# Patient Record
Sex: Female | Born: 1955
Health system: Southern US, Community
[De-identification: ages and names within clinical notes are randomized; demographics above are authoritative.]

---

## 2007-10-13 ENCOUNTER — Other Ambulatory Visit: Admission: RE | Admit: 2007-10-13 | Discharge: 2007-10-13 | Payer: Self-pay | Admitting: Obstetrics & Gynecology

## 2008-06-25 ENCOUNTER — Inpatient Hospital Stay (HOSPITAL_COMMUNITY): Admission: EM | Admit: 2008-06-25 | Discharge: 2008-06-28 | Payer: Self-pay | Admitting: Emergency Medicine

## 2008-06-25 ENCOUNTER — Encounter (INDEPENDENT_AMBULATORY_CARE_PROVIDER_SITE_OTHER): Payer: Self-pay | Admitting: Surgery

## 2008-06-25 ENCOUNTER — Encounter: Payer: Self-pay | Admitting: Emergency Medicine

## 2008-07-13 ENCOUNTER — Inpatient Hospital Stay (HOSPITAL_COMMUNITY): Admission: EM | Admit: 2008-07-13 | Discharge: 2008-07-14 | Payer: Self-pay | Admitting: Emergency Medicine

## 2008-07-13 ENCOUNTER — Encounter: Payer: Self-pay | Admitting: Emergency Medicine

## 2008-10-17 ENCOUNTER — Other Ambulatory Visit: Admission: RE | Admit: 2008-10-17 | Discharge: 2008-10-17 | Payer: Self-pay | Admitting: Obstetrics and Gynecology

## 2011-04-09 NOTE — H&P (Signed)
NAMELORANA, MAFFEO               ACCOUNT NO.:  [id number]   MEDICAL RECORD NO.:  [id number]          PATIENT TYPE:  INP   LOCATION:  5531                         FACILITY:  [facility]   PHYSICIAN:  Ollen Gross. Vernell Morgans, M.D. DATE OF BIRTH:  [birth year]   DATE OF ADMISSION:  07/13/2008  DATE OF DISCHARGE:                              HISTORY & PHYSICAL   The patient is a 55 year old white female who is now about 2 weeks out  from open appendectomy by Dr. Fannie Knee for perforated appendicitis.  She  initially did well and has been at home, but over the last day has  develop some redness and swelling at her incision site.  She denies any  fevers.  She has had a reasonable appetite and her bowels have been  moving regularly for her.  She came to the emergency department and was  found to have a normal white count.  No fever.  She did undergo a CT  scan that showed an abdominal wall abscess in the subcutaneous tissue.   PAST MEDICAL HISTORY:  Significant for some psychiatric issues.   PAST SURGICAL HISTORY:  Significant for an open appendectomy.   MEDICATIONS:  Some psych meds.   ALLERGIES:  No known drug allergies.   SOCIAL HISTORY:  She denies any tobacco or tobacco products.   FAMILY HISTORY:  Noncontributory.   PHYSICAL EXAMINATION:  VITAL SIGNS:  Temperature is 97.8, blood pressure  112/73, and pulse of 80.  GENERAL:  She is a well-developed, well-nourished white female, in no  acute distress.  SKIN:  Warm and dry.  No jaundice.  HEENT:  Eyes are anicteric.  Extraocular movements are intact.  Pupils  are equal, round, and reactive to light.  Sclerae nonicteric.  LUNGS:  Clear bilaterally with no use of accessory respiratory muscles.  HEART:  Regular rate and rhythm with impulse in the left chest.  ABDOMEN:  Soft.  She does in the  right lower quadrant have an area of  redness and swelling at the incision, but the incision itself is intact  and healed, does not show any signs of opening or  drainage.  EXTREMITIES:  No cyanosis, clubbing, or edema.  Good strength in arms  and legs.  PSYCHOLOGIC:  She is alert and oriented x3 with no evidence of anxiety  or depression.   ASSESSMENT AND PLAN:  This is a 55 year old white female with what  appears to be a small abscess in her subcutaneous abdominal wall after  open appendectomy over 2 weeks ago.  We will plan to admit her for IV  antibiotics and plan for ultrasound-guided aspiration of this fluid  collection by interventional radiology in the morning.      Ollen Gross. Vernell Morgans, M.D.  Electronically Signed     PST/MEDQ  D:  07/13/2008  T:  07/13/2008  Job:  [id number]

## 2011-04-09 NOTE — Discharge Summary (Signed)
Joan Nolan, Joan Nolan               ACCOUNT NO.:  [id number]   MEDICAL RECORD NO.:  [id number]          PATIENT TYPE:  INP   LOCATION:  5121                         FACILITY:  [facility]   PHYSICIAN:  Wilmon Arms. Corliss Skains, M.D. DATE OF BIRTH:  22-Sep-1956   DATE OF ADMISSION:  06/25/2008  DATE OF DISCHARGE:  06/28/2008                               DISCHARGE SUMMARY   DISCHARGING PHYSICIAN:  Wynona Luna, MD.   CHIEF COMPLAINT AND REASON FOR ADMISSION:  Joan Nolan is a 51-year female  patient with history of ADHD and anxiety and depression on multiple  medications who presents with a 3-day history of right lower quadrant  abdominal pain without nausea and vomiting.  No fever.  She was seen  initially at the [facility] in [facility].  A CT was done  that demonstrated appendicitis, and the patient was subsequently  transferred over to [facility].   PHYSICAL EXAMINATION:  On exam, the patient's vital signs were stable.  Her abdomen showed right lower quadrant pain and tenderness.  No  rebounding, no guarding.  Her white count was 16,100, hemoglobin 12.5,  platelets 219,000.  Sodium 139, potassium 3.9, creatinine 0.7.   Incidental finding was also of a right ovarian cyst on the CT scan.  The  patient was diagnosed with acute appendicitis admitted to the hospital.   ADMITTING DIAGNOSES:  1. Acute appendicitis.  2. ADHD.  3. Anxiety and depression.   HOSPITAL COURSE:  The patient was taken from the ER to the OR, where she  underwent laparoscopic converted to open appendectomy.  Please refer to  Dr. Corliss Skains operative notes for details.  Basically, there was a  significant degree of adhesive material with the appendix being very  enlarged, and adhered to the cecum and they were unable to mobilize the  appendix with a laparoscopic approach.  So, therefore she was converted  to an open appendectomy.  She did not have any perforation of the  appendix.  The patient otherwise tolerated  the procedure well and was  sent back to the general floor to recover.   In the postoperative period, the patient was placed empirically on IV  Unasyn and IV fluids, and diet was advanced slowly.  On postoperative  day #0, the patient had a T-max of 102.6.  Other than her vital signs  were stable.  Her pain was being managed with PCA and she was tolerating  a clear liquid diet.  By postop day #1 the patient's white count had  decreased to 11,300, hemoglobin was 11.7 after hydration.  She was  ambulating in the room and was ready to have her diet advanced to a soft  diet and was switched to oral pain medications.  She was given a dose of  Toradol and then transitioned over to ibuprofen and was started on p.o.  Vicodin.   On postop day number #2, June 28, 2008, the patient was afebrile.  Vital signs stable.  Abdomen was unremarkable.  Dressings were removed  to reveal clean and intact staple lines over her trocar sites, as  well  as over the much larger right lower quadrant incision.  Bowel sounds  were present.  The patient's intraoperative pathology returned positive  for acute appendicitis, as well as diverticulosis with diverticulitis.  I discussed this with Dr. Corliss Skains, and he reported that all the area of  acute diverticulitis had been removed during surgery and she would not  need antibiotic therapy regarding this issue after discharge.   FINAL AND DISCHARGE DIAGNOSES:  1. Acute appendicitis status post laparoscopic converted to open      appendectomy.  2. New diagnosis of diverticulosis with focal diverticulitis      associated with appendicitis, stable.  3. ADHD.  4. Anxiety and depression.   DISCHARGE MEDICATIONS:  The patient will resume the following home  medications:  1. Folic acid 400 mg b.i.d.  2. Strattera 25 mg two q.a.m.  3. Strattera 25 mg 1 midday  4. Strattera 25 mg 1 in the afternoon.  5. Lamictal 100 mg 1 to 1-1/2 tablet in the morning.  6. Lamictal 100 mg 1  to 1-1/2 tablet 6 hours later.  7. Vyvanse 70 mg in the morning.  8. Klonopin 0.5 mg midday.  9. Zyprexa 2.5 mg at hour of sleep.  10.Fish oil capsules in the morning.  11.Calcium 600 mg p.r.n.  12.Vitamin B complex one-half b.i.d.   NEW MEDICATIONS:  1. Vicodin 5/325 mg 1-2 tablets every 4 hours as need for pain.  2. Ibuprofen 600 mg t.i.d. as needed for pain.  May take in addition      to Vicodin, take with food or snack.   OTHER DISCHARGE INSTRUCTIONS:  No restrictions in her diet.  Wound care.  She is to pat the staple line dry after showering and if she finds that  the incisions are rubbing against her clothing or draining, or otherwise  uncomfortable, she may use a sanitary napkin or purchase dressing  material to place over these areas.   ACTIVITY:  She is to increase activity slowly.  She may walk up steps.  She may shower.  No lifting greater than 10 pounds for 4 weeks.  No  driving for 1-2 weeks, and this is based on when she is comfortable or  not utilizing Vicodin.   OTHER INSTRUCTIONS:  She is to call the surgeon if a fever greater than  or equal to 101.5 degrees Fahrenheit, the new increased belly pain, see  redness or drainage from the wounds site, nausea, vomiting, or diarrhea.   FOLLOW-UP APPOINTMENTS:  She is to call Dr. Fatima Sanger office at [phone]  to be seen in 5-7 days for staple removal.      Allison L. Rondel Jumbo. Tsuei, M.D.  Electronically Signed    ALE/MEDQ  D:  06/28/2008  T:  06/28/2008  Job:  [id number]   cc:   Wilmon Arms. Tsuei, M.D.

## 2011-04-09 NOTE — Op Note (Signed)
NAMEYOKO, MCGAHEE               ACCOUNT NO.:  [id number]   MEDICAL RECORD NO.:  [id number]          PATIENT TYPE:  INP   LOCATION:  5121                         FACILITY:  [facility]   PHYSICIAN:  Wilmon Arms. Corliss Skains, M.D. DATE OF BIRTH:  January 02, 1956   DATE OF PROCEDURE:  06/26/2008  DATE OF DISCHARGE:                               OPERATIVE REPORT   PREOPERATIVE DIAGNOSIS:  Acute appendicitis.   POSTOPERATIVE DIAGNOSIS:  Acute appendicitis.   PROCEDURE PERFORMED:  Laparoscopic-converted open appendectomy.   SURGEON:  Wilmon Arms. Tsuei, MD   ANESTHESIA:  General endotracheal.   INDICATIONS:  The patient is a 56 year old female who presents with 3 or  4 days of right lower quadrant abdominal pain associated with some mild  nausea.  She was noted to have elevated white count.  Her abdomen was  only mildly tender in the right lower quadrant.  However, a CT scan  showed a very inflamed right lower quadrant consistent with  appendicitis.  They could not rule out an abscess.  We were consulted  and recommended to bring the patient directly to the operating room for  laparoscopic appendectomy.   DESCRIPTION OF PROCEDURE:  The patient was brought to the operating room  and placed in the supine position on the operating table.  After an  adequate level of general anesthesia was obtained, a Foley catheter was  placed under sterile technique.  The patient's abdomen was prepped with  Betadine and draped in a sterile fashion.  A time-out was taken to  assure the proper patient and proper procedure.  We infiltrated the area  below her umbilicus with 0.25% Marcaine with epinephrine.  A transverse  incision was made and dissection was carried down to the fascia.  The  fascia was grasped with Kocher clamps and opened vertically.  The  peritoneal cavity was bluntly entered.  A stay suture of 0 Vicryl was  placed around the fascial opening.  Pneumoperitoneum was obtained  insufflating CO2, maintaining  maximal pressure of 15 mmHg.  The  laparoscope was inserted and we did not note any obvious purulent.  There was a lot of free fluid in the pelvis.  A 5-mm port was placed in  the right upper quadrant and another 5-mm port in the left lower  quadrant.  We switched to a 5-mm 30-degree scope.  We moved to the right  upper quadrant.  We then mobilized the cecum medially.  There were a lot  of dense inflammatory adhesions in the right lower quadrant.  We tried  to carefully dissect through these.  However, the cecum and what  appeared to be the appendix were densely adherent down into the pelvis.  We bluntly dissected under the uterus.  It appeared that the enlarged  appendix was heading down into the pelvis adjacent to the fallopian tube  on the right.  Despite continued meticulous blunt dissection, we were  unable to mobilize the appendix.  Therefore, the decision was made to  convert to an open procedure.  We made a transverse incision in the  right lower quadrant directly over the  base of the cecum.  We used a  Engineer, manufacturing for exposure.  We bluntly dissected a very large  appendix free from the surrounding tissue with blunt dissection as well  as the harmonic scalpel.  The base of the appendix was then divided with  a GIA-55 stapler.  We inspected the surrounding tissue and there was no  sign of leak or bleeding.  We thoroughly irrigated the pelvis and  suctioned dry.  The peritoneum was closed with running layer of 2-0  Vicryl.  A 0 PDS was used to close the fascia.  Staples were used to close all  the skin incisions.  The patient was then extubated and brought to the  recovery room in stable condition.  All sponge, instrument, and needle  counts were correct.      Wilmon Arms. Tsuei, M.D.  Electronically Signed     MKT/MEDQ  D:  06/26/2008  T:  06/26/2008  Job:  [id number]

## 2011-04-09 NOTE — Discharge Summary (Signed)
[name], [name]               ACCOUNT NO.:  [id number]   MEDICAL RECORD NO.:  [id number]          PATIENT TYPE:  INP   LOCATION:  5531                         FACILITY:  [facility]   PHYSICIAN:  Ardeth Sportsman, MD     DATE OF BIRTH:  04/18/1956   DATE OF ADMISSION:  07/13/2008  DATE OF DISCHARGE:  07/14/2008                               DISCHARGE SUMMARY   ADMITTING PHYSICIAN:  Ollen Gross. Vernell Morgans, M.D.   DISCHARGING PHYSICIAN:  Ardeth Sportsman, MD   PRIMARY SURGEON:  Wilmon Arms. Corliss Skains, MD   CHIEF COMPLAINT/REASON FOR ADMISSION:  Ms. Joan Nolan is a 55 year old female  patient about 2 weeks out from laparoscopy converted to open  appendectomy for a nonperforated appendix.  There was some difficulty in  removing the appendix due to its adherence to the cecum and that is what  warranted the open appendectomy procedure.  She presented to the ER on  the day of admission with a 1-day history of redness and swelling at her  incision site.  Her white cell count was normal.  She had no fever.  CT  demonstrated an abdominal wall abscess without any intraperitoneal  abscess or any communication with the intraperitoneal cavity.  The  patient was subsequently admitted by Dr. Carolynne Edouard with the following  diagnoses:  1. Right lower quadrant abdominal wall abscess at site of prior open      appendectomy.  2. Attention deficit hyperactivity disorder.  3. Anxiety and depression.   HOSPITAL COURSE:  The patient was admitted as stated and placed on  n.p.o. status.  Started empirically on IV Zosyn and Interventional  Radiology was consulted for percutaneous drainage of the said abscess.   On hospital day #1, July 13, 2008, the patient was taken to  Interventional Radiology where she underwent a successful placement of a  percutaneous drain into the right lower quadrant abdominal wall fluid  collection cavity.  Fluid returned was bloody and serosanguineous.  Cultures were obtained and on the date of  discharge, hospital day #2,  the patient's cultures had returned back consistent with moderate gram-  positive cocci in pairs.  Final culture was pending.  The patient was  having no pain, prior incisional redness had resolved, and she was  otherwise deemed appropriate for discharge home.  She received previous  instruction on flushing and management of the drain and plans were for  her to return home with management of her own drain.  Her vital signs  were stable and she was afebrile.   FINAL DISCHARGE DIAGNOSES:  1. Seroma, right lower quadrant abdominal wall with preliminary      cultures showing gram-positive cocci in pairs.  2. Appendicitis, suppurative nonperforated.   DISCHARGE MEDICATIONS:  1. The patient will resume the same home medication she was taking      prior to last admission.  Please see discharge summary from      previous admission.  2. In addition, she will be taking Augmentin 875 mg b.i.d. for 7 days      for empiric coverage until cultures are finalized from  the seroma      aspiration.  3. She will utilize over-the-counter ibuprofen and Tylenol as needed      for pain.   Return to work as previously discussed.  I think her prior instructions  stated a total of 4-5 weeks due to the open appendectomy procedure.   DIET:  No restrictions.   WOUND CARE:  Flush the right abdominal drain every 8 hours or 3 times a  day with 10 mL of normal saline.  Record drainage output, noting that  once drainage minus the flushes is down to 10 mL in a 24-hour period.  She will need a repeat CT scan.   ACTIVITY:  Increase activity slowly.  Sponge bathe only while the drain  is in place.  No lifting for the next 2 weeks.   FOLLOWUP:  I would recommend that she see Dr. Corliss Skains in 1 week.  Hopefully, I will be able to make this appointment for the patient prior  to her discharge.      Allison L. Eliott Nine, MD  Electronically Signed    ALE/MEDQ   D:  07/14/2008  T:  07/14/2008  Job:  [id number]   cc:   Wilmon Arms. Tsuei, M.D.

## 2011-08-23 LAB — DIFFERENTIAL
Basophils Relative: 1
Eosinophils Absolute: 0.2
Eosinophils Relative: 1
Lymphs Abs: 1.3
Monocytes Absolute: 1.4 — ABNORMAL HIGH
Neutro Abs: 13 — ABNORMAL HIGH
Neutrophils Relative %: 81 — ABNORMAL HIGH

## 2011-08-23 LAB — CBC
HCT: 37.8
Hemoglobin: 11.7 — ABNORMAL LOW
Hemoglobin: 12.2
Hemoglobin: 12.5
MCHC: 32.5
MCHC: 33
MCHC: 33.2
MCV: 75.8 — ABNORMAL LOW
Platelets: 218
RBC: 4.68
RBC: 4.98
RDW: 18 — ABNORMAL HIGH
RDW: 18.3 — ABNORMAL HIGH
WBC: 16.1 — ABNORMAL HIGH

## 2011-08-23 LAB — URINALYSIS, ROUTINE W REFLEX MICROSCOPIC
Hgb urine dipstick: NEGATIVE
Ketones, ur: 15 — AB
Nitrite: NEGATIVE
pH: 5.5

## 2011-08-23 LAB — BASIC METABOLIC PANEL
BUN: 13
BUN: 5 — ABNORMAL LOW
CO2: 27
CO2: 29
Calcium: 8 — ABNORMAL LOW
Calcium: 8.9
Chloride: 98
Creatinine, Ser: 0.7
GFR calc non Af Amer: >60
Glucose, Bld: 141 — ABNORMAL HIGH
Glucose, Bld: 93
Sodium: 135

## 2016-08-09 DIAGNOSIS — F840 Autistic disorder: Secondary | ICD-10-CM | POA: Diagnosis not present

## 2016-08-20 DIAGNOSIS — F840 Autistic disorder: Secondary | ICD-10-CM | POA: Diagnosis not present

## 2016-09-03 DIAGNOSIS — F840 Autistic disorder: Secondary | ICD-10-CM | POA: Diagnosis not present

## 2016-09-09 DIAGNOSIS — F411 Generalized anxiety disorder: Secondary | ICD-10-CM | POA: Diagnosis not present

## 2016-09-09 DIAGNOSIS — F3170 Bipolar disorder, currently in remission, most recent episode unspecified: Secondary | ICD-10-CM | POA: Diagnosis not present

## 2016-09-17 DIAGNOSIS — F840 Autistic disorder: Secondary | ICD-10-CM | POA: Diagnosis not present

## 2016-10-01 DIAGNOSIS — F840 Autistic disorder: Secondary | ICD-10-CM | POA: Diagnosis not present

## 2016-10-11 DIAGNOSIS — F840 Autistic disorder: Secondary | ICD-10-CM | POA: Diagnosis not present

## 2016-12-06 DIAGNOSIS — R928 Other abnormal and inconclusive findings on diagnostic imaging of breast: Secondary | ICD-10-CM | POA: Diagnosis not present

## 2016-12-06 DIAGNOSIS — R922 Inconclusive mammogram: Secondary | ICD-10-CM | POA: Diagnosis not present

## 2017-01-22 DIAGNOSIS — F411 Generalized anxiety disorder: Secondary | ICD-10-CM | POA: Diagnosis not present

## 2017-01-22 DIAGNOSIS — F3170 Bipolar disorder, currently in remission, most recent episode unspecified: Secondary | ICD-10-CM | POA: Diagnosis not present

## 2017-01-22 DIAGNOSIS — F29 Unspecified psychosis not due to a substance or known physiological condition: Secondary | ICD-10-CM | POA: Diagnosis not present

## 2017-01-22 DIAGNOSIS — G2571 Drug induced akathisia: Secondary | ICD-10-CM | POA: Diagnosis not present

## 2017-02-19 DIAGNOSIS — Z6832 Body mass index (BMI) 32.0-32.9, adult: Secondary | ICD-10-CM | POA: Diagnosis not present

## 2017-02-19 DIAGNOSIS — Z01419 Encounter for gynecological examination (general) (routine) without abnormal findings: Secondary | ICD-10-CM | POA: Diagnosis not present

## 2017-02-19 DIAGNOSIS — Z803 Family history of malignant neoplasm of breast: Secondary | ICD-10-CM | POA: Diagnosis not present

## 2017-04-03 DIAGNOSIS — F339 Major depressive disorder, recurrent, unspecified: Secondary | ICD-10-CM | POA: Diagnosis not present

## 2017-04-03 DIAGNOSIS — Z6841 Body Mass Index (BMI) 40.0 and over, adult: Secondary | ICD-10-CM | POA: Diagnosis not present

## 2017-04-03 DIAGNOSIS — E559 Vitamin D deficiency, unspecified: Secondary | ICD-10-CM | POA: Diagnosis not present

## 2017-04-03 DIAGNOSIS — Z0001 Encounter for general adult medical examination with abnormal findings: Secondary | ICD-10-CM | POA: Diagnosis not present

## 2017-04-03 DIAGNOSIS — Z0000 Encounter for general adult medical examination without abnormal findings: Secondary | ICD-10-CM | POA: Diagnosis not present

## 2017-05-08 DIAGNOSIS — J069 Acute upper respiratory infection, unspecified: Secondary | ICD-10-CM | POA: Diagnosis not present

## 2017-05-11 DIAGNOSIS — J069 Acute upper respiratory infection, unspecified: Secondary | ICD-10-CM | POA: Diagnosis not present

## 2017-05-11 DIAGNOSIS — S058X1A Other injuries of right eye and orbit, initial encounter: Secondary | ICD-10-CM | POA: Diagnosis not present

## 2017-06-11 DIAGNOSIS — F29 Unspecified psychosis not due to a substance or known physiological condition: Secondary | ICD-10-CM | POA: Diagnosis not present

## 2017-06-11 DIAGNOSIS — F3170 Bipolar disorder, currently in remission, most recent episode unspecified: Secondary | ICD-10-CM | POA: Diagnosis not present

## 2017-06-11 DIAGNOSIS — G2571 Drug induced akathisia: Secondary | ICD-10-CM | POA: Diagnosis not present

## 2017-06-11 DIAGNOSIS — F411 Generalized anxiety disorder: Secondary | ICD-10-CM | POA: Diagnosis not present

## 2017-07-18 DIAGNOSIS — H5213 Myopia, bilateral: Secondary | ICD-10-CM | POA: Diagnosis not present

## 2017-07-25 DIAGNOSIS — N6489 Other specified disorders of breast: Secondary | ICD-10-CM | POA: Diagnosis not present

## 2017-07-25 DIAGNOSIS — N6001 Solitary cyst of right breast: Secondary | ICD-10-CM | POA: Diagnosis not present

## 2017-07-25 DIAGNOSIS — N6312 Unspecified lump in the right breast, upper inner quadrant: Secondary | ICD-10-CM | POA: Diagnosis not present

## 2017-08-26 ENCOUNTER — Ambulatory Visit (INDEPENDENT_AMBULATORY_CARE_PROVIDER_SITE_OTHER): Payer: BLUE CROSS/BLUE SHIELD | Admitting: Clinical

## 2017-08-26 DIAGNOSIS — F840 Autistic disorder: Secondary | ICD-10-CM

## 2017-09-09 ENCOUNTER — Ambulatory Visit (INDEPENDENT_AMBULATORY_CARE_PROVIDER_SITE_OTHER): Payer: BLUE CROSS/BLUE SHIELD | Admitting: Clinical

## 2017-09-09 DIAGNOSIS — F840 Autistic disorder: Secondary | ICD-10-CM

## 2017-10-07 ENCOUNTER — Ambulatory Visit (INDEPENDENT_AMBULATORY_CARE_PROVIDER_SITE_OTHER): Payer: BLUE CROSS/BLUE SHIELD | Admitting: Clinical

## 2017-10-07 DIAGNOSIS — F840 Autistic disorder: Secondary | ICD-10-CM

## 2017-10-20 DIAGNOSIS — G2571 Drug induced akathisia: Secondary | ICD-10-CM | POA: Diagnosis not present

## 2017-10-20 DIAGNOSIS — F3170 Bipolar disorder, currently in remission, most recent episode unspecified: Secondary | ICD-10-CM | POA: Diagnosis not present

## 2017-10-20 DIAGNOSIS — F411 Generalized anxiety disorder: Secondary | ICD-10-CM | POA: Diagnosis not present

## 2017-10-20 DIAGNOSIS — F29 Unspecified psychosis not due to a substance or known physiological condition: Secondary | ICD-10-CM | POA: Diagnosis not present

## 2017-11-04 ENCOUNTER — Ambulatory Visit: Payer: Self-pay | Admitting: Clinical

## 2017-11-11 ENCOUNTER — Ambulatory Visit (INDEPENDENT_AMBULATORY_CARE_PROVIDER_SITE_OTHER): Payer: BLUE CROSS/BLUE SHIELD | Admitting: Clinical

## 2017-11-11 DIAGNOSIS — F840 Autistic disorder: Secondary | ICD-10-CM

## 2017-12-16 ENCOUNTER — Ambulatory Visit: Payer: BLUE CROSS/BLUE SHIELD | Admitting: Clinical

## 2017-12-16 DIAGNOSIS — F840 Autistic disorder: Secondary | ICD-10-CM | POA: Diagnosis not present

## 2018-01-15 ENCOUNTER — Ambulatory Visit: Payer: BLUE CROSS/BLUE SHIELD | Admitting: Clinical

## 2018-01-15 DIAGNOSIS — F840 Autistic disorder: Secondary | ICD-10-CM | POA: Diagnosis not present

## 2018-02-12 ENCOUNTER — Ambulatory Visit: Payer: BLUE CROSS/BLUE SHIELD | Admitting: Clinical

## 2018-02-12 DIAGNOSIS — F840 Autistic disorder: Secondary | ICD-10-CM

## 2018-03-12 ENCOUNTER — Ambulatory Visit (INDEPENDENT_AMBULATORY_CARE_PROVIDER_SITE_OTHER): Payer: BLUE CROSS/BLUE SHIELD | Admitting: Clinical

## 2018-03-12 DIAGNOSIS — F840 Autistic disorder: Secondary | ICD-10-CM | POA: Diagnosis not present

## 2018-03-23 DIAGNOSIS — Z23 Encounter for immunization: Secondary | ICD-10-CM | POA: Diagnosis not present

## 2018-03-26 ENCOUNTER — Ambulatory Visit: Payer: BLUE CROSS/BLUE SHIELD | Admitting: Clinical

## 2018-03-26 DIAGNOSIS — F840 Autistic disorder: Secondary | ICD-10-CM

## 2018-04-01 DIAGNOSIS — F3170 Bipolar disorder, currently in remission, most recent episode unspecified: Secondary | ICD-10-CM | POA: Diagnosis not present

## 2018-04-01 DIAGNOSIS — F29 Unspecified psychosis not due to a substance or known physiological condition: Secondary | ICD-10-CM | POA: Diagnosis not present

## 2018-04-01 DIAGNOSIS — F411 Generalized anxiety disorder: Secondary | ICD-10-CM | POA: Diagnosis not present

## 2018-04-01 DIAGNOSIS — G2571 Drug induced akathisia: Secondary | ICD-10-CM | POA: Diagnosis not present

## 2018-04-02 ENCOUNTER — Ambulatory Visit: Payer: BLUE CROSS/BLUE SHIELD | Admitting: Clinical

## 2018-04-02 DIAGNOSIS — F840 Autistic disorder: Secondary | ICD-10-CM | POA: Diagnosis not present

## 2018-04-16 ENCOUNTER — Ambulatory Visit: Payer: BLUE CROSS/BLUE SHIELD | Admitting: Clinical

## 2018-04-16 DIAGNOSIS — F840 Autistic disorder: Secondary | ICD-10-CM | POA: Diagnosis not present

## 2018-04-23 ENCOUNTER — Ambulatory Visit: Payer: BLUE CROSS/BLUE SHIELD | Admitting: Clinical

## 2018-04-23 DIAGNOSIS — F319 Bipolar disorder, unspecified: Secondary | ICD-10-CM

## 2018-04-30 ENCOUNTER — Ambulatory Visit: Payer: BLUE CROSS/BLUE SHIELD | Admitting: Clinical

## 2018-04-30 DIAGNOSIS — F840 Autistic disorder: Secondary | ICD-10-CM

## 2018-05-07 ENCOUNTER — Ambulatory Visit: Payer: BLUE CROSS/BLUE SHIELD | Admitting: Clinical

## 2018-05-07 DIAGNOSIS — F319 Bipolar disorder, unspecified: Secondary | ICD-10-CM

## 2018-05-14 ENCOUNTER — Ambulatory Visit: Payer: BLUE CROSS/BLUE SHIELD | Admitting: Clinical

## 2018-05-14 DIAGNOSIS — F319 Bipolar disorder, unspecified: Secondary | ICD-10-CM | POA: Diagnosis not present

## 2018-05-21 ENCOUNTER — Ambulatory Visit: Payer: BLUE CROSS/BLUE SHIELD | Admitting: Clinical

## 2018-05-28 DIAGNOSIS — Z23 Encounter for immunization: Secondary | ICD-10-CM | POA: Diagnosis not present

## 2018-06-04 ENCOUNTER — Ambulatory Visit: Payer: BLUE CROSS/BLUE SHIELD | Admitting: Clinical

## 2018-06-04 DIAGNOSIS — F319 Bipolar disorder, unspecified: Secondary | ICD-10-CM | POA: Diagnosis not present

## 2018-06-11 ENCOUNTER — Ambulatory Visit: Payer: BLUE CROSS/BLUE SHIELD | Admitting: Clinical

## 2018-06-11 DIAGNOSIS — F319 Bipolar disorder, unspecified: Secondary | ICD-10-CM | POA: Diagnosis not present

## 2018-06-18 ENCOUNTER — Ambulatory Visit: Payer: BLUE CROSS/BLUE SHIELD | Admitting: Clinical

## 2018-06-18 DIAGNOSIS — F319 Bipolar disorder, unspecified: Secondary | ICD-10-CM | POA: Diagnosis not present

## 2018-07-02 ENCOUNTER — Ambulatory Visit (INDEPENDENT_AMBULATORY_CARE_PROVIDER_SITE_OTHER): Payer: BLUE CROSS/BLUE SHIELD | Admitting: Clinical

## 2018-07-02 DIAGNOSIS — F319 Bipolar disorder, unspecified: Secondary | ICD-10-CM

## 2018-07-09 ENCOUNTER — Ambulatory Visit: Payer: BLUE CROSS/BLUE SHIELD | Admitting: Clinical

## 2018-07-09 DIAGNOSIS — F319 Bipolar disorder, unspecified: Secondary | ICD-10-CM | POA: Diagnosis not present

## 2018-07-16 ENCOUNTER — Ambulatory Visit: Payer: BLUE CROSS/BLUE SHIELD | Admitting: Clinical

## 2018-07-16 DIAGNOSIS — F840 Autistic disorder: Secondary | ICD-10-CM | POA: Diagnosis not present

## 2018-07-21 DIAGNOSIS — F3170 Bipolar disorder, currently in remission, most recent episode unspecified: Secondary | ICD-10-CM | POA: Diagnosis not present

## 2018-07-21 DIAGNOSIS — F411 Generalized anxiety disorder: Secondary | ICD-10-CM | POA: Diagnosis not present

## 2018-07-21 DIAGNOSIS — F29 Unspecified psychosis not due to a substance or known physiological condition: Secondary | ICD-10-CM | POA: Diagnosis not present

## 2018-07-21 DIAGNOSIS — G2571 Drug induced akathisia: Secondary | ICD-10-CM | POA: Diagnosis not present

## 2018-07-23 ENCOUNTER — Ambulatory Visit: Payer: BLUE CROSS/BLUE SHIELD | Admitting: Clinical

## 2018-07-23 DIAGNOSIS — F319 Bipolar disorder, unspecified: Secondary | ICD-10-CM | POA: Diagnosis not present

## 2018-08-06 ENCOUNTER — Ambulatory Visit: Payer: BLUE CROSS/BLUE SHIELD | Admitting: Clinical

## 2018-08-06 DIAGNOSIS — F319 Bipolar disorder, unspecified: Secondary | ICD-10-CM

## 2018-08-12 DIAGNOSIS — F3170 Bipolar disorder, currently in remission, most recent episode unspecified: Secondary | ICD-10-CM | POA: Diagnosis not present

## 2018-08-12 DIAGNOSIS — F411 Generalized anxiety disorder: Secondary | ICD-10-CM | POA: Diagnosis not present

## 2018-08-12 DIAGNOSIS — F29 Unspecified psychosis not due to a substance or known physiological condition: Secondary | ICD-10-CM | POA: Diagnosis not present

## 2018-08-12 DIAGNOSIS — G2571 Drug induced akathisia: Secondary | ICD-10-CM | POA: Diagnosis not present

## 2018-08-13 ENCOUNTER — Ambulatory Visit: Payer: BLUE CROSS/BLUE SHIELD | Admitting: Clinical

## 2018-08-13 DIAGNOSIS — F319 Bipolar disorder, unspecified: Secondary | ICD-10-CM

## 2018-08-20 ENCOUNTER — Ambulatory Visit: Payer: BLUE CROSS/BLUE SHIELD | Admitting: Clinical

## 2018-08-20 DIAGNOSIS — F319 Bipolar disorder, unspecified: Secondary | ICD-10-CM

## 2018-08-24 ENCOUNTER — Ambulatory Visit: Payer: BLUE CROSS/BLUE SHIELD | Admitting: Clinical

## 2018-08-24 DIAGNOSIS — F319 Bipolar disorder, unspecified: Secondary | ICD-10-CM

## 2018-09-01 DIAGNOSIS — F29 Unspecified psychosis not due to a substance or known physiological condition: Secondary | ICD-10-CM | POA: Diagnosis not present

## 2018-09-01 DIAGNOSIS — F3170 Bipolar disorder, currently in remission, most recent episode unspecified: Secondary | ICD-10-CM | POA: Diagnosis not present

## 2018-09-01 DIAGNOSIS — F411 Generalized anxiety disorder: Secondary | ICD-10-CM | POA: Diagnosis not present

## 2018-09-01 DIAGNOSIS — G2571 Drug induced akathisia: Secondary | ICD-10-CM | POA: Diagnosis not present

## 2018-09-03 ENCOUNTER — Ambulatory Visit: Payer: BLUE CROSS/BLUE SHIELD | Admitting: Clinical

## 2018-09-03 DIAGNOSIS — F319 Bipolar disorder, unspecified: Secondary | ICD-10-CM

## 2018-09-10 ENCOUNTER — Ambulatory Visit: Payer: BLUE CROSS/BLUE SHIELD | Admitting: Clinical

## 2018-09-10 DIAGNOSIS — F319 Bipolar disorder, unspecified: Secondary | ICD-10-CM | POA: Diagnosis not present

## 2018-09-17 ENCOUNTER — Ambulatory Visit: Payer: BLUE CROSS/BLUE SHIELD | Admitting: Clinical

## 2018-09-17 DIAGNOSIS — F319 Bipolar disorder, unspecified: Secondary | ICD-10-CM

## 2018-09-24 ENCOUNTER — Ambulatory Visit: Payer: BLUE CROSS/BLUE SHIELD | Admitting: Clinical

## 2018-09-29 DIAGNOSIS — F411 Generalized anxiety disorder: Secondary | ICD-10-CM | POA: Diagnosis not present

## 2018-09-29 DIAGNOSIS — F3170 Bipolar disorder, currently in remission, most recent episode unspecified: Secondary | ICD-10-CM | POA: Diagnosis not present

## 2018-09-29 DIAGNOSIS — F29 Unspecified psychosis not due to a substance or known physiological condition: Secondary | ICD-10-CM | POA: Diagnosis not present

## 2018-09-29 DIAGNOSIS — G2571 Drug induced akathisia: Secondary | ICD-10-CM | POA: Diagnosis not present

## 2018-10-01 ENCOUNTER — Ambulatory Visit: Payer: BLUE CROSS/BLUE SHIELD | Admitting: Clinical

## 2018-10-01 DIAGNOSIS — F319 Bipolar disorder, unspecified: Secondary | ICD-10-CM

## 2018-10-08 ENCOUNTER — Ambulatory Visit: Payer: BLUE CROSS/BLUE SHIELD | Admitting: Clinical

## 2018-10-08 DIAGNOSIS — F319 Bipolar disorder, unspecified: Secondary | ICD-10-CM | POA: Diagnosis not present

## 2018-10-15 ENCOUNTER — Ambulatory Visit: Payer: BLUE CROSS/BLUE SHIELD | Admitting: Clinical

## 2018-10-15 DIAGNOSIS — F319 Bipolar disorder, unspecified: Secondary | ICD-10-CM

## 2018-10-29 ENCOUNTER — Ambulatory Visit: Payer: BLUE CROSS/BLUE SHIELD | Admitting: Clinical

## 2018-10-29 DIAGNOSIS — F319 Bipolar disorder, unspecified: Secondary | ICD-10-CM | POA: Diagnosis not present

## 2018-11-05 ENCOUNTER — Ambulatory Visit: Payer: BLUE CROSS/BLUE SHIELD | Admitting: Clinical

## 2018-11-05 DIAGNOSIS — F319 Bipolar disorder, unspecified: Secondary | ICD-10-CM | POA: Diagnosis not present

## 2018-11-12 ENCOUNTER — Ambulatory Visit: Payer: BLUE CROSS/BLUE SHIELD | Admitting: Clinical

## 2018-11-12 DIAGNOSIS — F319 Bipolar disorder, unspecified: Secondary | ICD-10-CM

## 2018-12-02 DIAGNOSIS — Z803 Family history of malignant neoplasm of breast: Secondary | ICD-10-CM | POA: Diagnosis not present

## 2018-12-02 DIAGNOSIS — Z1239 Encounter for other screening for malignant neoplasm of breast: Secondary | ICD-10-CM | POA: Diagnosis not present

## 2018-12-02 DIAGNOSIS — Z01419 Encounter for gynecological examination (general) (routine) without abnormal findings: Secondary | ICD-10-CM | POA: Diagnosis not present

## 2018-12-02 DIAGNOSIS — Z6831 Body mass index (BMI) 31.0-31.9, adult: Secondary | ICD-10-CM | POA: Diagnosis not present

## 2018-12-03 ENCOUNTER — Ambulatory Visit (INDEPENDENT_AMBULATORY_CARE_PROVIDER_SITE_OTHER): Payer: BLUE CROSS/BLUE SHIELD | Admitting: Clinical

## 2018-12-03 DIAGNOSIS — F319 Bipolar disorder, unspecified: Secondary | ICD-10-CM | POA: Diagnosis not present

## 2018-12-10 ENCOUNTER — Ambulatory Visit: Payer: BLUE CROSS/BLUE SHIELD | Admitting: Clinical

## 2018-12-17 ENCOUNTER — Ambulatory Visit (INDEPENDENT_AMBULATORY_CARE_PROVIDER_SITE_OTHER): Payer: BLUE CROSS/BLUE SHIELD | Admitting: Clinical

## 2018-12-17 DIAGNOSIS — F319 Bipolar disorder, unspecified: Secondary | ICD-10-CM

## 2018-12-24 ENCOUNTER — Ambulatory Visit: Payer: BLUE CROSS/BLUE SHIELD | Admitting: Clinical

## 2018-12-31 ENCOUNTER — Ambulatory Visit (INDEPENDENT_AMBULATORY_CARE_PROVIDER_SITE_OTHER): Payer: BLUE CROSS/BLUE SHIELD | Admitting: Clinical

## 2018-12-31 DIAGNOSIS — F319 Bipolar disorder, unspecified: Secondary | ICD-10-CM | POA: Diagnosis not present

## 2019-01-05 DIAGNOSIS — F3170 Bipolar disorder, currently in remission, most recent episode unspecified: Secondary | ICD-10-CM | POA: Diagnosis not present

## 2019-01-05 DIAGNOSIS — F29 Unspecified psychosis not due to a substance or known physiological condition: Secondary | ICD-10-CM | POA: Diagnosis not present

## 2019-01-05 DIAGNOSIS — G2571 Drug induced akathisia: Secondary | ICD-10-CM | POA: Diagnosis not present

## 2019-01-05 DIAGNOSIS — F411 Generalized anxiety disorder: Secondary | ICD-10-CM | POA: Diagnosis not present

## 2019-01-07 ENCOUNTER — Ambulatory Visit: Payer: BLUE CROSS/BLUE SHIELD | Admitting: Clinical

## 2019-01-14 ENCOUNTER — Ambulatory Visit: Payer: BLUE CROSS/BLUE SHIELD | Admitting: Clinical

## 2019-01-21 ENCOUNTER — Ambulatory Visit: Payer: BLUE CROSS/BLUE SHIELD | Admitting: Clinical

## 2019-01-28 ENCOUNTER — Ambulatory Visit: Payer: BLUE CROSS/BLUE SHIELD | Admitting: Clinical

## 2019-02-04 ENCOUNTER — Ambulatory Visit: Payer: BLUE CROSS/BLUE SHIELD | Admitting: Clinical

## 2019-02-23 DIAGNOSIS — F3170 Bipolar disorder, currently in remission, most recent episode unspecified: Secondary | ICD-10-CM | POA: Diagnosis not present

## 2019-02-23 DIAGNOSIS — F411 Generalized anxiety disorder: Secondary | ICD-10-CM | POA: Diagnosis not present

## 2019-02-23 DIAGNOSIS — F29 Unspecified psychosis not due to a substance or known physiological condition: Secondary | ICD-10-CM | POA: Diagnosis not present

## 2019-02-23 DIAGNOSIS — G2571 Drug induced akathisia: Secondary | ICD-10-CM | POA: Diagnosis not present

## 2019-02-25 ENCOUNTER — Ambulatory Visit: Payer: BLUE CROSS/BLUE SHIELD | Admitting: Clinical

## 2019-03-18 ENCOUNTER — Ambulatory Visit: Payer: BLUE CROSS/BLUE SHIELD | Admitting: Clinical

## 2019-04-01 ENCOUNTER — Ambulatory Visit: Payer: BLUE CROSS/BLUE SHIELD | Admitting: Clinical

## 2019-04-15 ENCOUNTER — Ambulatory Visit: Payer: BLUE CROSS/BLUE SHIELD | Admitting: Clinical

## 2019-05-20 DIAGNOSIS — F4320 Adjustment disorder, unspecified: Secondary | ICD-10-CM | POA: Diagnosis not present

## 2019-05-27 DIAGNOSIS — F4320 Adjustment disorder, unspecified: Secondary | ICD-10-CM | POA: Diagnosis not present

## 2019-06-03 DIAGNOSIS — F4320 Adjustment disorder, unspecified: Secondary | ICD-10-CM | POA: Diagnosis not present

## 2019-06-10 DIAGNOSIS — F4320 Adjustment disorder, unspecified: Secondary | ICD-10-CM | POA: Diagnosis not present

## 2019-06-17 DIAGNOSIS — F4320 Adjustment disorder, unspecified: Secondary | ICD-10-CM | POA: Diagnosis not present

## 2019-06-24 DIAGNOSIS — F4320 Adjustment disorder, unspecified: Secondary | ICD-10-CM | POA: Diagnosis not present

## 2019-06-29 DIAGNOSIS — F411 Generalized anxiety disorder: Secondary | ICD-10-CM | POA: Diagnosis not present

## 2019-06-29 DIAGNOSIS — F29 Unspecified psychosis not due to a substance or known physiological condition: Secondary | ICD-10-CM | POA: Diagnosis not present

## 2019-06-29 DIAGNOSIS — F3170 Bipolar disorder, currently in remission, most recent episode unspecified: Secondary | ICD-10-CM | POA: Diagnosis not present

## 2019-06-29 DIAGNOSIS — G2571 Drug induced akathisia: Secondary | ICD-10-CM | POA: Diagnosis not present

## 2019-07-08 DIAGNOSIS — F4320 Adjustment disorder, unspecified: Secondary | ICD-10-CM | POA: Diagnosis not present

## 2019-07-15 DIAGNOSIS — F4320 Adjustment disorder, unspecified: Secondary | ICD-10-CM | POA: Diagnosis not present

## 2019-07-22 DIAGNOSIS — F4320 Adjustment disorder, unspecified: Secondary | ICD-10-CM | POA: Diagnosis not present

## 2019-07-27 ENCOUNTER — Other Ambulatory Visit: Payer: Self-pay | Admitting: Internal Medicine

## 2019-07-27 ENCOUNTER — Other Ambulatory Visit: Payer: Self-pay

## 2019-07-27 ENCOUNTER — Ambulatory Visit
Admission: RE | Admit: 2019-07-27 | Discharge: 2019-07-27 | Disposition: A | Discharge status: Home / Self Care | Payer: BC Managed Care – PPO | Source: Ambulatory Visit | Attending: Internal Medicine | Admitting: Internal Medicine

## 2019-07-27 DIAGNOSIS — Z0000 Encounter for general adult medical examination without abnormal findings: Secondary | ICD-10-CM | POA: Diagnosis not present

## 2019-07-27 DIAGNOSIS — M25562 Pain in left knee: Secondary | ICD-10-CM

## 2019-07-27 DIAGNOSIS — B351 Tinea unguium: Secondary | ICD-10-CM | POA: Diagnosis not present

## 2019-07-27 DIAGNOSIS — M25462 Effusion, left knee: Secondary | ICD-10-CM | POA: Diagnosis not present

## 2019-07-27 DIAGNOSIS — Z6829 Body mass index (BMI) 29.0-29.9, adult: Secondary | ICD-10-CM | POA: Diagnosis not present

## 2019-07-27 DIAGNOSIS — M25569 Pain in unspecified knee: Secondary | ICD-10-CM | POA: Diagnosis not present

## 2019-08-12 DIAGNOSIS — F4320 Adjustment disorder, unspecified: Secondary | ICD-10-CM | POA: Diagnosis not present

## 2019-08-18 DIAGNOSIS — F29 Unspecified psychosis not due to a substance or known physiological condition: Secondary | ICD-10-CM | POA: Diagnosis not present

## 2019-08-18 DIAGNOSIS — F411 Generalized anxiety disorder: Secondary | ICD-10-CM | POA: Diagnosis not present

## 2019-08-18 DIAGNOSIS — F3170 Bipolar disorder, currently in remission, most recent episode unspecified: Secondary | ICD-10-CM | POA: Diagnosis not present

## 2019-08-18 DIAGNOSIS — G2571 Drug induced akathisia: Secondary | ICD-10-CM | POA: Diagnosis not present

## 2019-08-19 DIAGNOSIS — F4320 Adjustment disorder, unspecified: Secondary | ICD-10-CM | POA: Diagnosis not present

## 2019-08-26 DIAGNOSIS — F4320 Adjustment disorder, unspecified: Secondary | ICD-10-CM | POA: Diagnosis not present

## 2019-08-31 DIAGNOSIS — L918 Other hypertrophic disorders of the skin: Secondary | ICD-10-CM | POA: Diagnosis not present

## 2019-09-09 DIAGNOSIS — F4320 Adjustment disorder, unspecified: Secondary | ICD-10-CM | POA: Diagnosis not present

## 2019-09-17 DIAGNOSIS — F4320 Adjustment disorder, unspecified: Secondary | ICD-10-CM | POA: Diagnosis not present

## 2019-09-22 DIAGNOSIS — F411 Generalized anxiety disorder: Secondary | ICD-10-CM | POA: Diagnosis not present

## 2019-09-22 DIAGNOSIS — F3170 Bipolar disorder, currently in remission, most recent episode unspecified: Secondary | ICD-10-CM | POA: Diagnosis not present

## 2019-09-22 DIAGNOSIS — F29 Unspecified psychosis not due to a substance or known physiological condition: Secondary | ICD-10-CM | POA: Diagnosis not present

## 2019-09-22 DIAGNOSIS — G2571 Drug induced akathisia: Secondary | ICD-10-CM | POA: Diagnosis not present

## 2019-09-23 DIAGNOSIS — F4320 Adjustment disorder, unspecified: Secondary | ICD-10-CM | POA: Diagnosis not present

## 2019-09-30 DIAGNOSIS — F4320 Adjustment disorder, unspecified: Secondary | ICD-10-CM | POA: Diagnosis not present

## 2019-10-07 DIAGNOSIS — F4320 Adjustment disorder, unspecified: Secondary | ICD-10-CM | POA: Diagnosis not present

## 2019-10-14 DIAGNOSIS — F4320 Adjustment disorder, unspecified: Secondary | ICD-10-CM | POA: Diagnosis not present

## 2019-10-20 DIAGNOSIS — F4320 Adjustment disorder, unspecified: Secondary | ICD-10-CM | POA: Diagnosis not present

## 2019-10-27 DIAGNOSIS — F411 Generalized anxiety disorder: Secondary | ICD-10-CM | POA: Diagnosis not present

## 2019-10-27 DIAGNOSIS — G2571 Drug induced akathisia: Secondary | ICD-10-CM | POA: Diagnosis not present

## 2019-10-27 DIAGNOSIS — F29 Unspecified psychosis not due to a substance or known physiological condition: Secondary | ICD-10-CM | POA: Diagnosis not present

## 2019-10-27 DIAGNOSIS — F3170 Bipolar disorder, currently in remission, most recent episode unspecified: Secondary | ICD-10-CM | POA: Diagnosis not present

## 2019-10-28 DIAGNOSIS — F4320 Adjustment disorder, unspecified: Secondary | ICD-10-CM | POA: Diagnosis not present

## 2021-05-02 IMAGING — CR DG KNEE COMPLETE 4+V*L*
4 series · 4 of 4 positions shown · non-contrast
Comparison: None.

CLINICAL DATA: Left anterior knee pain and swelling x 4 months with
NKI

EXAM:
LEFT KNEE - COMPLETE 4+ VIEW

[t knee ap left]
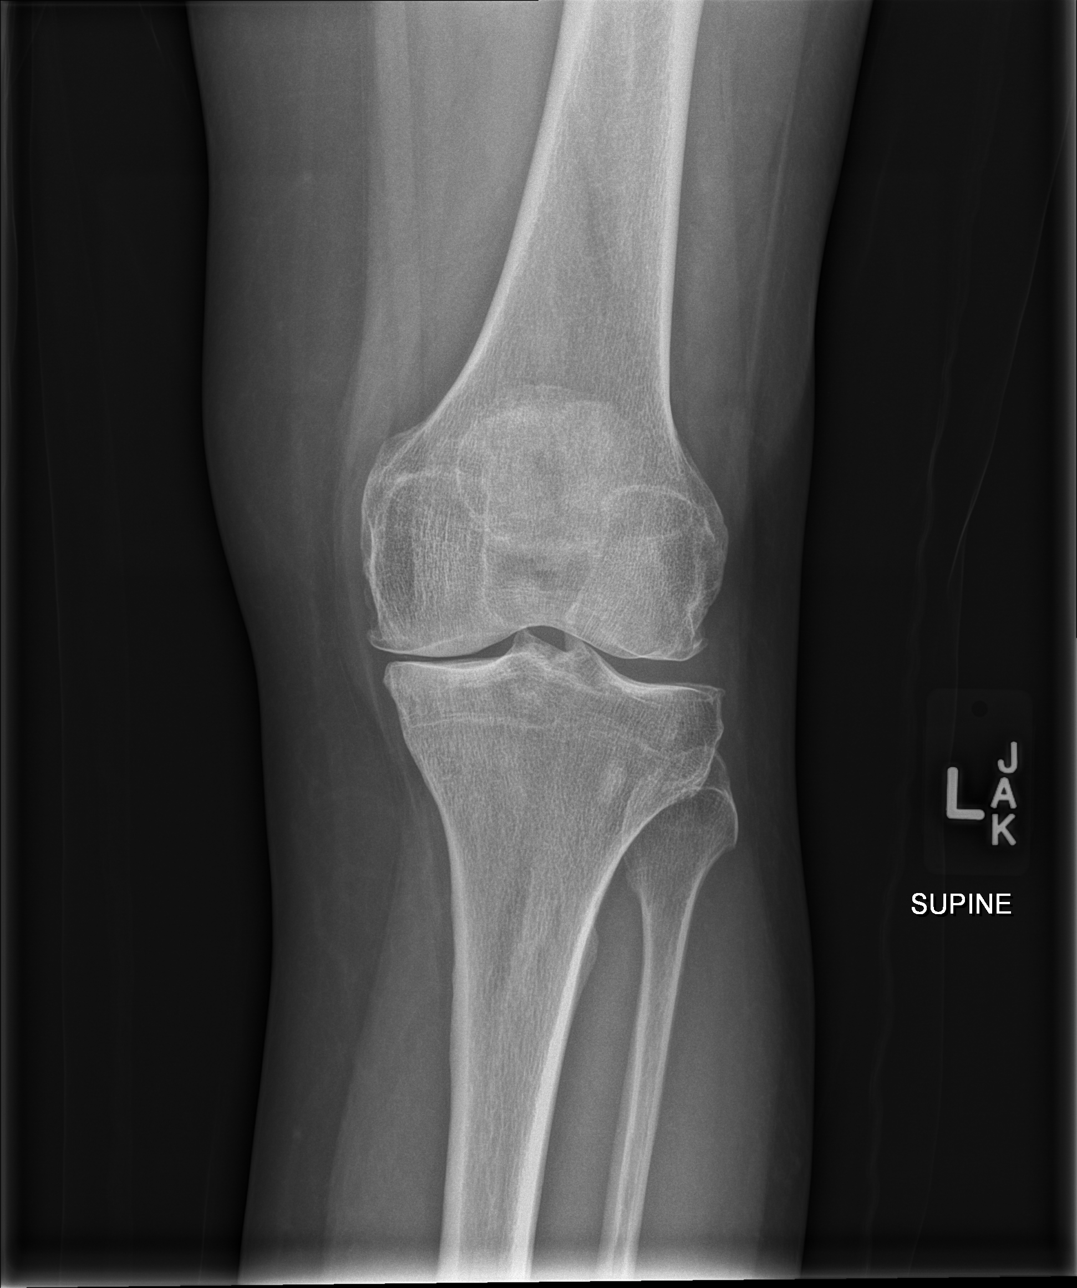

[t knee obl left (1 of 2)]
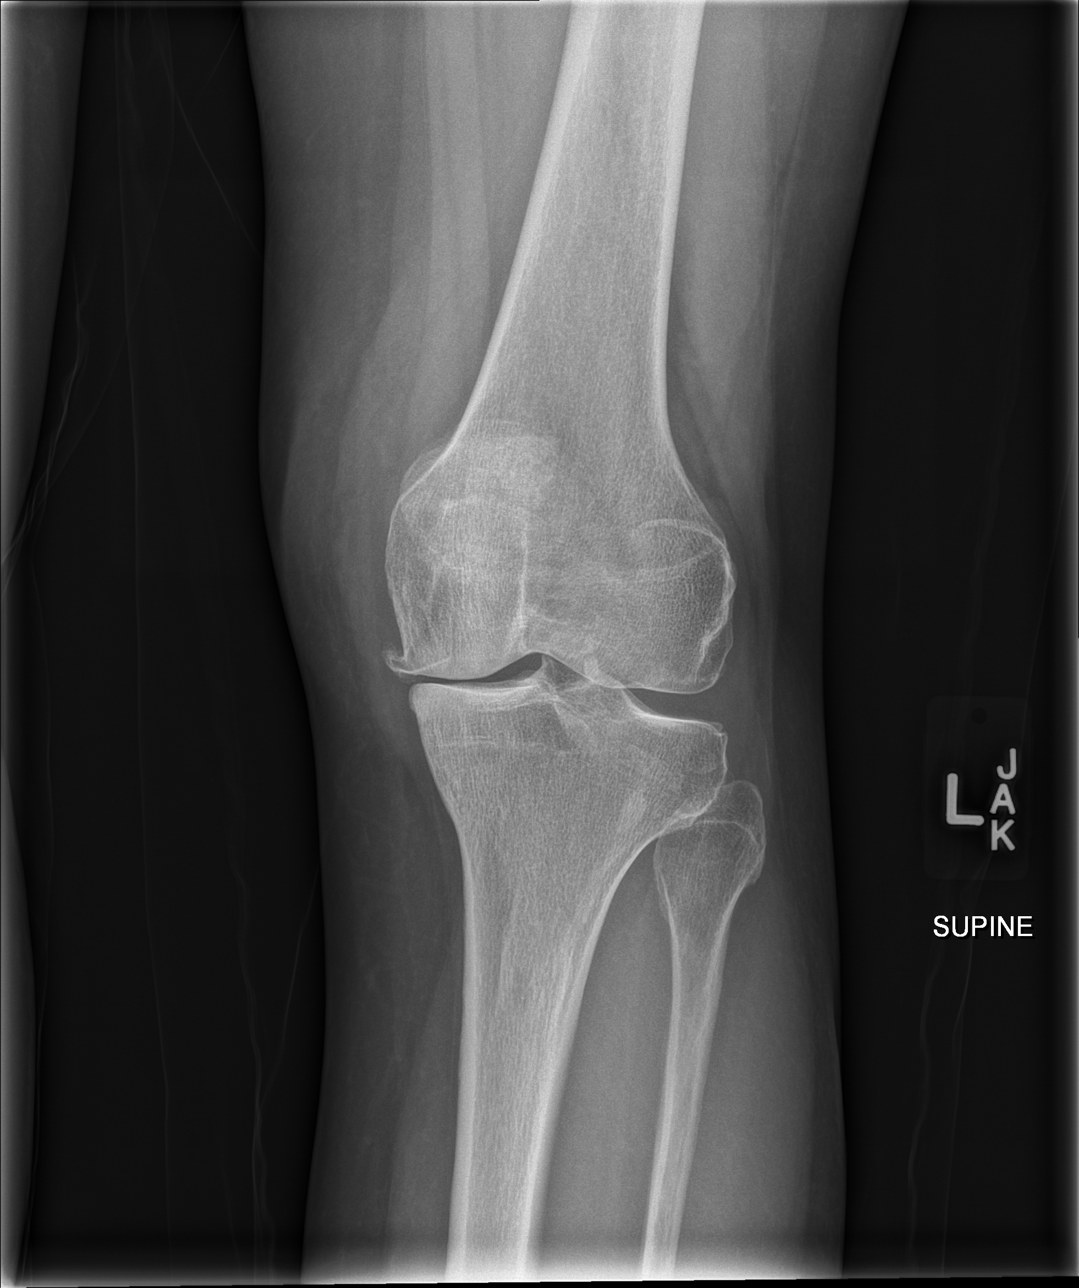

[t knee obl left (2 of 2)]
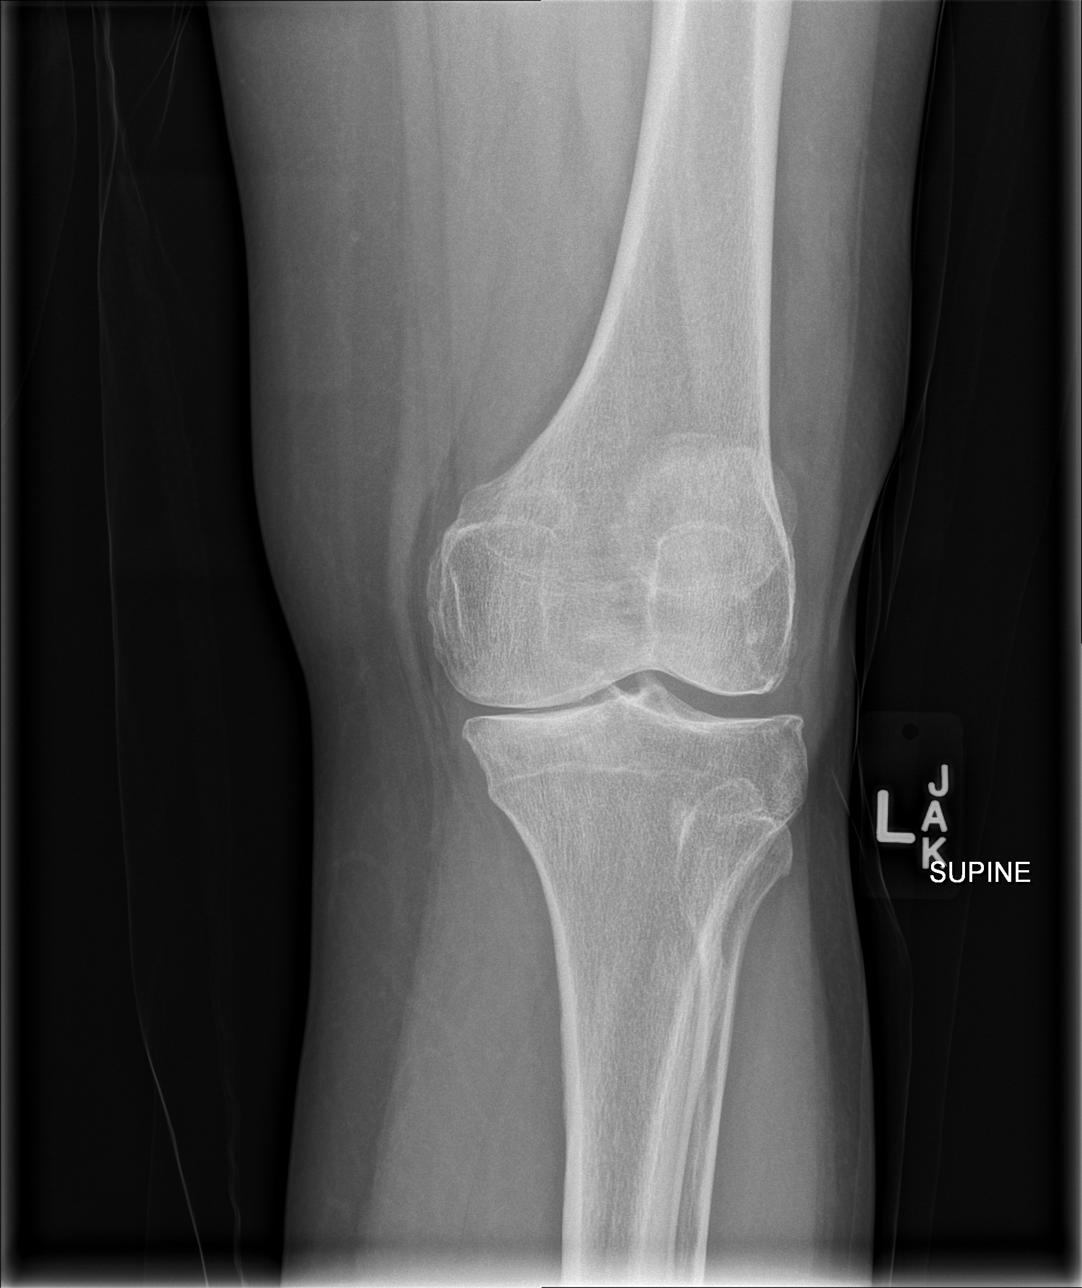

[t knee lat left]
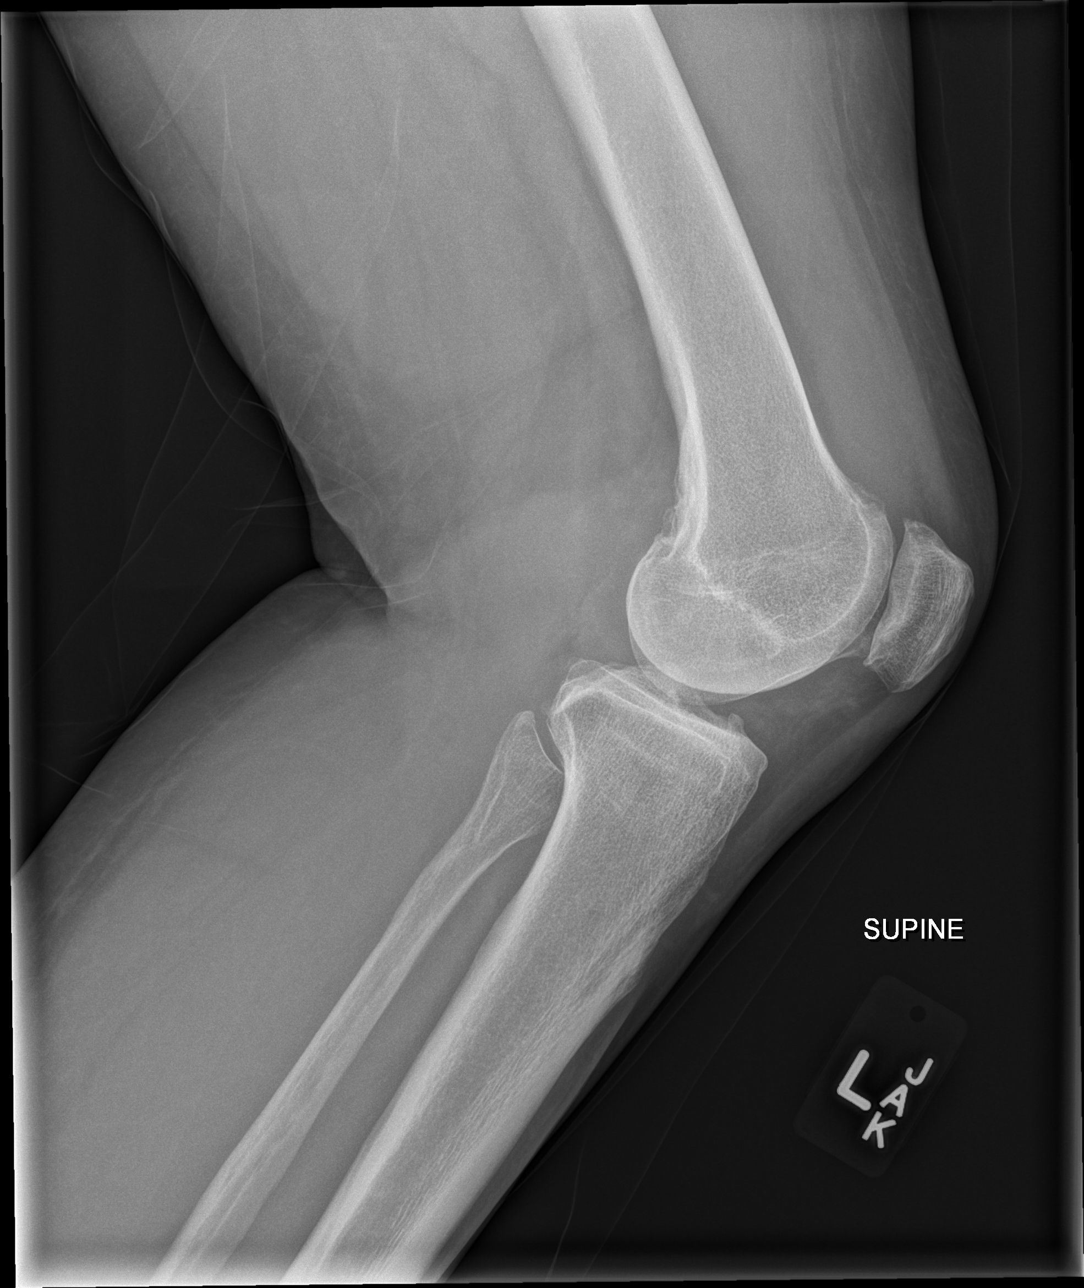

[4 of 4 positions shown; findings below may reference images not displayed]

FINDINGS: No evidence of fracture or dislocation. Moderate size knee joint
effusion. Tricompartmental moderate degenerative changes. Moderate
medial compartment joint space narrowing. Soft tissues are
unremarkable.
IMPRESSION: Moderate left knee joint effusion without acute osseous abnormality.

## 2021-09-04 ENCOUNTER — Ambulatory Visit: Payer: BC Managed Care – PPO | Admitting: Podiatry

## 2021-09-10 ENCOUNTER — Other Ambulatory Visit: Payer: Self-pay

## 2021-09-10 ENCOUNTER — Ambulatory Visit (INDEPENDENT_AMBULATORY_CARE_PROVIDER_SITE_OTHER): Payer: BC Managed Care – PPO | Admitting: Podiatry

## 2021-09-10 DIAGNOSIS — B351 Tinea unguium: Secondary | ICD-10-CM

## 2021-09-10 NOTE — Patient Instructions (Signed)
Terbinafine tablets What is this medication? TERBINAFINE (TER bin a feen) is an antifungal medicine. It is used to treatcertain kinds of fungal or yeast infections. This medicine may be used for other purposes; ask your health care provider orpharmacist if you have questions. COMMON BRAND NAME(S): Lamisil, Terbinex What should I tell my care team before I take this medication? They need to know if you have any of these conditions: drink alcoholic beverages kidney disease liver disease an unusual or allergic reaction to terbinafine, other medicines, foods, dyes, or preservatives pregnant or trying to get pregnant breast-feeding How should I use this medication? Take this medicine by mouth with a full glass of water. Follow the directions on the prescription label. You can take this medicine with food or on an empty stomach. Take your medicine at regular intervals. Do not take your medicine more often than directed. Do not skip doses or stop your medicine early even ifyou feel better. Do not stop taking except on your doctor's advice. A special MedGuide will be given to you by the pharmacist with eachprescription and refill. Be sure to read this information carefully each time. Talk to your pediatrician regarding the use of this medicine in children.Special care may be needed. Overdosage: If you think you have taken too much of this medicine contact apoison control center or emergency room at once. NOTE: This medicine is only for you. Do not share this medicine with others. What if I miss a dose? If you miss a dose, take it as soon as you can. If it is almost time for yournext dose, take only that dose. Do not take double or extra doses. What may interact with this medication? Do not take this medicine with any of the following medications: thioridazine This medicine may also interact with the following medications: beta-blockers caffeine cimetidine cyclosporine medicines for depression,  anxiety, or psychotic disturbances medicines for fungal infections like fluconazole and ketoconazole medicines for irregular heartbeat like amiodarone, flecainide and propafenone rifampin warfarin This list may not describe all possible interactions. Give your health care provider a list of all the medicines, herbs, non-prescription drugs, or dietary supplements you use. Also tell them if you smoke, drink alcohol, or use illegaldrugs. Some items may interact with your medicine. What should I watch for while using this medication? Visit your doctor or health care provider regularly. Tell your doctor right away if you have nausea or vomiting, loss of appetite, stomach pain on your right upper side, yellow skin, dark urine, light stools, or are over tired. Some fungal infections need many weeks or months of treatment to cure. If you are taking this medicine for a long time, you will need to have important bloodwork done. This medicine may cause serious skin reactions. They can happen weeks to months after starting the medicine. Contact your health care provider right away if you notice fevers or flu-like symptoms with a rash. The rash may be red or purple and then turn into blisters or peeling of the skin. Or, you might notice a red rash with swelling of the face, lips or lymph nodes in your neck or underyour arms. What side effects may I notice from receiving this medication? Side effects that you should report to your doctor or health care professionalas soon as possible: allergic reactions like skin rash or hives, swelling of the face, lips, or tongue changes in vision dark urine fever or infection general ill feeling or flu-like symptoms light-colored stools loss of appetite, nausea rash, fever,   and swollen lymph nodes redness, blistering, peeling or loosening of the skin, including inside the mouth right upper belly pain unusually weak or tired yellowing of the eyes or skin Side effects that  usually do not require medical attention (report to yourdoctor or health care professional if they continue or are bothersome): changes in taste diarrhea hair loss muscle or joint pain stomach gas stomach upset This list may not describe all possible side effects. Call your doctor for medical advice about side effects. You may report side effects to FDA at1-800-FDA-1088. Where should I keep my medication? Keep out of the reach of children. Store at room temperature below 25 degrees C (77 degrees F). Protect fromlight. Throw away any unused medicine after the expiration date. NOTE: This sheet is a summary. It may not cover all possible information. If you have questions about this medicine, talk to your doctor, pharmacist, orhealth care provider.  2022 Elsevier/Gold Standard (2019-02-19 15:37:07)  

## 2021-09-11 NOTE — Progress Notes (Signed)
Subjective:   Patient ID: Joan Nolan, female   DOB: 65 y.o.   MRN: [id number]   HPI 65 year old female presents the office today for concerns of toenail fungus, thickening.  This is most on the right big toenail.  She has no significant pain to the nail at this time.  No swelling redness or any drainage.  She is tried medications over-the-counter for nail fungus but significant improvement she is interested in further treatment versus nail removal.   Review of Systems  All other systems reviewed and are negative.  No past medical history on file.     Current Outpatient Medications:    lumateperone tosylate (CAPLYTA) 42 MG capsule, Take by mouth., Disp: , Rfl:    metFORMIN (GLUCOPHAGE-XR) 500 MG 24 hr tablet, Take by mouth., Disp: , Rfl:    cloNIDine (CATAPRES) 0.1 MG tablet, Take 0.2-0.3 mg by mouth at bedtime., Disp: , Rfl:    cyclobenzaprine (FLEXERIL) 10 MG tablet, Take 10 mg by mouth at bedtime., Disp: , Rfl:    FLUoxetine (PROZAC) 10 MG tablet, Take by mouth., Disp: , Rfl:    gabapentin (NEURONTIN) 100 MG capsule, Take by mouth., Disp: , Rfl:    lithium carbonate (LITHOBID) 300 MG CR tablet, Take by mouth., Disp: , Rfl:    LORazepam (ATIVAN) 0.5 MG tablet, Take by mouth., Disp: , Rfl:    OLANZapine (ZYPREXA) 2.5 MG tablet, Take by mouth., Disp: , Rfl:    propranolol (INDERAL) 20 MG tablet, Take by mouth., Disp: , Rfl:   Not on File        Objective:  Physical Exam  General: AAO x3, NAD  Dermatological: Nails appear to be somewhat hypertrophic, dystrophic, most notably is the right first toenail which is hypertrophic, dystrophic with yellow-brown discoloration.  Subungual debris present.  No significant pain is no edema, erythema or signs of infection.  No drainage or any open sores.  No significant tinea pedis or interdigital maceration today.  Vascular: Dorsalis Pedis artery and Posterior Tibial artery pedal pulses are 2/4 bilateral with immedate capillary fill time.  Pedal hair growth present. There is no pain with calf compression, swelling, warmth, erythema.   Neruologic: Grossly intact via light touch bilateral.   Musculoskeletal: No gross boney pedal deformities bilateral. No pain, crepitus, or limitation noted with foot and ankle range of motion bilateral. Muscular strength 5/5 in all groups tested bilateral.  Gait: Unassisted, Nonantalgic.       Assessment:   Onychomycosis     Plan:  -Treatment options discussed including all alternatives, risks, and complications -Etiology of symptoms were discussed -We discussed various treatment options for nail fungus including oral treatment versus topical versus nail removal.  I doubt that topical be helpful given the thickening of the nail.  Discussed possibly starting Lamisil and she is in think about this and I gave her information she can read about the side effects which we also discussed today.  Discussed that removal permanently.  She does not consider this as well and she does not know what she decides to do.  Trula Slade DPM

## 2021-09-14 NOTE — Addendum Note (Signed)
Addended by: Cranford Mon R on: 09/14/2021 10:22 AM   Modules accepted: Orders

## 2021-10-01 ENCOUNTER — Telehealth: Payer: Self-pay | Admitting: *Deleted

## 2021-10-01 ENCOUNTER — Other Ambulatory Visit: Payer: Self-pay | Admitting: *Deleted

## 2021-10-01 ENCOUNTER — Other Ambulatory Visit: Payer: Self-pay | Admitting: Podiatry

## 2021-10-01 DIAGNOSIS — Z79899 Other long term (current) drug therapy: Secondary | ICD-10-CM

## 2021-10-01 NOTE — Telephone Encounter (Signed)
Patient is calling to let the physician know that she is ready to start the Terbinafine medication. Please send to pharmacy on file.

## 2021-10-01 NOTE — Telephone Encounter (Signed)
Returned the call to patient, no answer, left vmessage of physician's instructions before starting Terbinafine.

## 2021-10-02 ENCOUNTER — Telehealth: Payer: Self-pay | Admitting: *Deleted

## 2021-10-02 NOTE — Telephone Encounter (Signed)
Patient is calling to ask if she could just get a liver function test done , just had CBC done on 09/24/21(could bring in those results). Would this be adequate? Please advise.

## 2021-10-09 ENCOUNTER — Other Ambulatory Visit: Payer: Self-pay | Admitting: Podiatry

## 2021-10-09 DIAGNOSIS — Z79899 Other long term (current) drug therapy: Secondary | ICD-10-CM

## 2021-10-09 LAB — HEPATIC FUNCTION PANEL
AG Ratio: 2.2 (calc) (ref 1.0–2.5)
ALT: 23 U/L (ref 6–29)
AST: 26 U/L (ref 10–35)
Albumin: 4.2 g/dL (ref 3.6–5.1)
Alkaline phosphatase (APISO): 77 U/L (ref 37–153)
Bilirubin, Direct: 0.1 mg/dL (ref 0.0–0.2)
Globulin: 1.9 g/dL (calc) (ref 1.9–3.7)
Indirect Bilirubin: 0.4 mg/dL (calc) (ref 0.2–1.2)
Total Bilirubin: 0.5 mg/dL (ref 0.2–1.2)
Total Protein: 6.1 g/dL (ref 6.1–8.1)

## 2021-10-09 LAB — CBC WITH DIFFERENTIAL/PLATELET
Absolute Monocytes: 541 cells/uL (ref 200–950)
Basophils Absolute: 132 cells/uL (ref 0–200)
Basophils Relative: 2 %
Eosinophils Absolute: 257 cells/uL (ref 15–500)
Eosinophils Relative: 3.9 %
HCT: 41.3 % (ref 35.0–45.0)
Hemoglobin: 13.5 g/dL (ref 11.7–15.5)
Lymphs Abs: 1195 cells/uL (ref 850–3900)
MCH: 26.9 pg — ABNORMAL LOW (ref 27.0–33.0)
MCHC: 32.7 g/dL (ref 32.0–36.0)
MCV: 82.4 fL (ref 80.0–100.0)
MPV: 10.5 fL (ref 7.5–12.5)
Monocytes Relative: 8.2 %
Neutro Abs: 4475 cells/uL (ref 1500–7800)
Neutrophils Relative %: 67.8 %
Platelets: 211 10*3/uL (ref 140–400)
RBC: 5.01 10*6/uL (ref 3.80–5.10)
RDW: 12.3 % (ref 11.0–15.0)
Total Lymphocyte: 18.1 %
WBC: 6.6 10*3/uL (ref 3.8–10.8)

## 2021-10-09 MED ORDER — TERBINAFINE HCL 250 MG PO TABS: 250.0000 mg | ORAL_TABLET | Freq: Every day | ORAL | 0 refills | Status: DC

## 2021-10-09 NOTE — Telephone Encounter (Signed)
Returned the call back to patient,no answer, left vmessage giving ok per Dr Jacqualyn Posey to bring in CBC(09/24/21) results.

## 2021-11-20 ENCOUNTER — Ambulatory Visit (INDEPENDENT_AMBULATORY_CARE_PROVIDER_SITE_OTHER): Payer: BC Managed Care – PPO | Admitting: Dermatology

## 2021-11-20 ENCOUNTER — Encounter: Payer: Self-pay | Admitting: Dermatology

## 2021-11-20 ENCOUNTER — Other Ambulatory Visit: Payer: Self-pay

## 2021-11-20 DIAGNOSIS — D485 Neoplasm of uncertain behavior of skin: Secondary | ICD-10-CM

## 2021-11-20 DIAGNOSIS — L820 Inflamed seborrheic keratosis: Secondary | ICD-10-CM

## 2021-11-20 DIAGNOSIS — Z1283 Encounter for screening for malignant neoplasm of skin: Secondary | ICD-10-CM

## 2021-11-20 DIAGNOSIS — B353 Tinea pedis: Secondary | ICD-10-CM

## 2021-11-20 DIAGNOSIS — L821 Other seborrheic keratosis: Secondary | ICD-10-CM | POA: Diagnosis not present

## 2021-11-20 DIAGNOSIS — D225 Melanocytic nevi of trunk: Secondary | ICD-10-CM

## 2021-11-20 DIAGNOSIS — D171 Benign lipomatous neoplasm of skin and subcutaneous tissue of trunk: Secondary | ICD-10-CM | POA: Diagnosis not present

## 2021-11-20 DIAGNOSIS — D1801 Hemangioma of skin and subcutaneous tissue: Secondary | ICD-10-CM

## 2021-11-20 NOTE — Patient Instructions (Signed)

## 2021-11-29 ENCOUNTER — Telehealth: Payer: Self-pay | Admitting: *Deleted

## 2021-11-29 NOTE — Telephone Encounter (Signed)
Patient is calling because she may be having a reaction to medication prescribed(terbinafine),she has started having numbness in toes, exp. @ night. Should she discontinue the medication or will this go away. Please advise. Called the patient, no answer, left vmessage to stop the medication to see if situation resolves no further medications prescribed at this time,any shortness of breath to go to ER per physician.

## 2021-12-17 ENCOUNTER — Encounter: Payer: Self-pay | Admitting: Dermatology

## 2021-12-17 NOTE — Progress Notes (Signed)
New Patient   Subjective  Joan Nolan is a 66 y.o. female who presents for the following: Annual Exam (Patient has lesions on left side neck, x years, no itching or bleeding. Lesion on left cheek, x years, no itching or bleeding, 3 lesions on back x years, no itching or bleeding. No person or family history of non skin cancers, atypical moles or melanoma.  ).  General skin examination, several areas of concern to patient Location:  Duration:  Quality:  Associated Signs/Symptoms: Modifying Factors:  Severity:  Timing: Context:    The following portions of the chart were reviewed this encounter and updated as appropriate:  Tobacco   Allergies   Meds   Problems   Med Hx   Surg Hx   Fam Hx       Objective  Well appearing patient in no apparent distress; mood and affect are within normal limits. General skin examination: Possible nonmelanoma skin cancer right upper back and 2 other lesions will be biopsied.  No atypical pigmented spots.   flattopped brown textured 4 to 6 mm papules with typical dermoscopy   Abdomen (Lower Torso, Anterior), Chest (Upper Torso, Anterior) 1 mm smooth dermal red papules.   Right Hallux Toe Nail Plate Focal distal yellowing, opacification, lifting with some subungual debris.  No involvement of skin.   Left Anterior Neck Inflamed 6 mm brown crust       Left Lower Back Soft pink projection, rule out neurofibroma       Right Upper Back Focally eroded pink pearly papule, rule out BCC         A full examination was performed including scalp, head, eyes, ears, nose, lips, neck, chest, axillae, abdomen, back, buttocks, bilateral upper extremities, bilateral lower extremities, hands, feet, fingers, toes, fingernails, and toenails. All findings within normal limits unless otherwise noted below.  Areas beneath undergarments not fully examined.   Assessment & Plan  Screening exam for skin cancer  Annual skin examination,  patient encouraged to check her own skin twice annually.  Continue ultraviolet protection.  Seborrheic keratosis  Benign, ok to leave unless clinically change for patient to chooses removal  Cherry angioma (2) Chest (Upper Torso, Anterior); Abdomen (Lower Torso, Anterior)  Benign, ok to leave  Tinea pedis of right foot Right Hallux Toe Nail Plate  Pt on oral meds from Dr. Earleen Newport  Neoplasm of uncertain behavior of skin (3) Left Anterior Neck  Skin / nail biopsy Type of biopsy: tangential   Informed consent: discussed and consent obtained   Timeout: patient name, date of birth, surgical site, and procedure verified   Anesthesia: the lesion was anesthetized in a standard fashion   Anesthetic:  1% lidocaine w/ epinephrine 1-100,000 local infiltration Instrument used: flexible razor blade   Hemostasis achieved with: aluminum chloride and electrodesiccation   Outcome: patient tolerated procedure well   Post-procedure details: wound care instructions given    Specimen 1 - Surgical pathology Differential Diagnosis: R/O BCC VS SCC  Check Margins: No  Left Lower Back  Skin / nail biopsy Type of biopsy: tangential   Informed consent: discussed and consent obtained   Timeout: patient name, date of birth, surgical site, and procedure verified   Anesthesia: the lesion was anesthetized in a standard fashion   Anesthetic:  1% lidocaine w/ epinephrine 1-100,000 local infiltration Instrument used: flexible razor blade   Hemostasis achieved with: aluminum chloride and electrodesiccation   Outcome: patient tolerated procedure well   Post-procedure details:  wound care instructions given    Specimen 2 - Surgical pathology Differential Diagnosis: R/O BCC VS SCC  Check Margins: No  Right Upper Back  Skin / nail biopsy Type of biopsy: tangential   Informed consent: discussed and consent obtained   Timeout: patient name, date of birth, surgical site, and procedure verified    Anesthesia: the lesion was anesthetized in a standard fashion   Anesthetic:  1% lidocaine w/ epinephrine 1-100,000 local infiltration Instrument used: flexible razor blade   Hemostasis achieved with: aluminum chloride and electrodesiccation   Outcome: patient tolerated procedure well   Post-procedure details: wound care instructions given    Specimen 3 - Surgical pathology Differential Diagnosis: R/O BCC VS SCC  Check Margins: No     Assessment & Plan  Screening exam for skin cancer  Annual skin examination, patient encouraged to check her own skin twice annually.  Continue ultraviolet protection.  Seborrheic keratosis  Benign, ok to leave unless clinically change for patient to chooses removal  Cherry angioma (2) Chest (Upper Torso, Anterior); Abdomen (Lower Torso, Anterior)  Benign, ok to leave  Tinea pedis of right foot Right Hallux Toe Nail Plate  Pt on oral meds from Dr. Earleen Newport  Neoplasm of uncertain behavior of skin (3) Left Anterior Neck  Skin / nail biopsy Type of biopsy: tangential   Informed consent: discussed and consent obtained   Timeout: patient name, date of birth, surgical site, and procedure verified   Anesthesia: the lesion was anesthetized in a standard fashion   Anesthetic:  1% lidocaine w/ epinephrine 1-100,000 local infiltration Instrument used: flexible razor blade   Hemostasis achieved with: aluminum chloride and electrodesiccation   Outcome: patient tolerated procedure well   Post-procedure details: wound care instructions given    Specimen 1 - Surgical pathology Differential Diagnosis: R/O BCC VS SCC  Check Margins: No  Left Lower Back  Skin / nail biopsy Type of biopsy: tangential   Informed consent: discussed and consent obtained   Timeout: patient name, date of birth, surgical site, and procedure verified   Anesthesia: the lesion was anesthetized in a standard fashion   Anesthetic:  1% lidocaine w/ epinephrine 1-100,000 local  infiltration Instrument used: flexible razor blade   Hemostasis achieved with: aluminum chloride and electrodesiccation   Outcome: patient tolerated procedure well   Post-procedure details: wound care instructions given    Specimen 2 - Surgical pathology Differential Diagnosis: R/O BCC VS SCC  Check Margins: No  Right Upper Back  Skin / nail biopsy Type of biopsy: tangential   Informed consent: discussed and consent obtained   Timeout: patient name, date of birth, surgical site, and procedure verified   Anesthesia: the lesion was anesthetized in a standard fashion   Anesthetic:  1% lidocaine w/ epinephrine 1-100,000 local infiltration Instrument used: flexible razor blade   Hemostasis achieved with: aluminum chloride and electrodesiccation   Outcome: patient tolerated procedure well   Post-procedure details: wound care instructions given    Specimen 3 - Surgical pathology Differential Diagnosis: R/O BCC VS SCC  Check Margins: No

## 2022-01-03 ENCOUNTER — Other Ambulatory Visit: Payer: Self-pay

## 2022-01-03 ENCOUNTER — Ambulatory Visit: Payer: BC Managed Care – PPO | Admitting: Podiatry

## 2022-01-03 DIAGNOSIS — B351 Tinea unguium: Secondary | ICD-10-CM

## 2022-01-03 MED ORDER — EFINACONAZOLE 10 % EX SOLN: 1.0000 [drp] | Freq: Every day | CUTANEOUS | 11 refills | Status: DC

## 2022-01-03 MED ORDER — TERBINAFINE HCL 250 MG PO TABS: 250.0000 mg | ORAL_TABLET | Freq: Every day | ORAL | 0 refills | Status: DC

## 2022-01-03 NOTE — Patient Instructions (Signed)
Efinaconazole Topical Solution °What is this medication? °EFINACONAZOLE (e FEE na KON a zole) is an antifungal medicine. It is used to treat certain kinds of fungal infections of the toenail. °This medicine may be used for other purposes; ask your health care provider or pharmacist if you have questions. °COMMON BRAND NAME(S): JUBLIA °What should I tell my care team before I take this medication? °They need to know if you have any of these conditions: °an unusual or allergic reaction to efinaconazole, other medicines, foods, dyes or preservatives °pregnant or trying to get pregnant °breast-feeding °How should I use this medication? °This medicine is for external use only. Do not take by mouth. Follow the directions on the label. Wash hands before and after use. Apply this medicine using the provided brush to cover the entire toenail. Do not use your medicine more often than directed. Finish the full course prescribed by your doctor or health care professional even if you think your condition is better. Do not stop using except on the advice of your doctor or health care professional. °Talk to your pediatrician regarding the use of this medicine in children. While this drug may be prescribed for children as young as 6 years for selected conditions, precautions do apply. °Overdosage: If you think you have taken too much of this medicine contact a poison control center or emergency room at once. °NOTE: This medicine is only for you. Do not share this medicine with others. °What if I miss a dose? °If you miss a dose, use it as soon as you can. If it is almost time for your next dose, use only that dose. Do not use double or extra doses. °What may interact with this medication? °Interactions have not been studied. Do not use any other nail products (i.e., nail polish, pedicures) during treatment with this medicine. °This list may not describe all possible interactions. Give your health care provider a list of all the  medicines, herbs, non-prescription drugs, or dietary supplements you use. Also tell them if you smoke, drink alcohol, or use illegal drugs. Some items may interact with your medicine. °What should I watch for while using this medication? °Do not get this medicine in your eyes. If you do, rinse out with plenty of cool tap water. °Tell your doctor or health care professional if your symptoms do not start to get better or if they get worse. °Wait for at least 10 minutes after bathing before applying this medication. After bathing, make sure that your feet are very dry. Fungal infections like moist conditions. Do not walk around barefoot. To help prevent reinfection, wear freshly washed cotton, not synthetic clothing. Tell your doctor or health care professional if you develop sores or blisters that do not heal properly. If your nail infection returns after you stop using this medicine, contact your doctor or health care professional. °What side effects may I notice from receiving this medication? °Side effects that you should report to your doctor or health care professional as soon as possible: °allergic reactions like skin rash, itching or hives, swelling of the face, lips, or tongue °ingrown toenail °Side effects that usually do not require medical attention (report to your doctor or health care professional if they continue or are bothersome): °mild skin irritation, burning, or itching °This list may not describe all possible side effects. Call your doctor for medical advice about side effects. You may report side effects to FDA at 1-800-FDA-1088. °Where should I keep my medication? °Keep out of the   reach of children. Store at room temperature between 20 and 25 degrees C (68 and 77 degrees F). Keep this medicine in the original container. Throw away any unused medicine after the expiration date. This medicine is flammable. Avoid exposure to heat, fire, flame, and smoking. NOTE: This sheet is a summary. It may  not cover all possible information. If you have questions about this medicine, talk to your doctor, pharmacist, or health care provider.  2022 Elsevier/Gold Standard (2019-03-24 00:00:00) Terbinafine Tablets What is this medication? TERBINAFINE (TER bin a feen) treats fungal infections of the nails. It belongs to a group of medications called antifungals. It will not treat infections caused by bacteria or viruses. This medicine may be used for other purposes; ask your health care provider or pharmacist if you have questions. COMMON BRAND NAME(S): Lamisil, Terbinex What should I tell my care team before I take this medication? They need to know if you have any of these conditions: Liver disease An unusual or allergic reaction to terbinafine, other medications, foods, dyes, or preservatives Pregnant or trying to get pregnant Breast-feeding How should I use this medication? Take this medication by mouth with water. Take it as directed on the prescription label at the same time every day. You can take it with or without food. If it upsets your stomach, take it with food. Keep taking it unless your care team tells you to stop. A special MedGuide will be given to you by the pharmacist with each prescription and refill. Be sure to read this information carefully each time. Talk to your care team regarding the use of this medication in children. Special care may be needed. Overdosage: If you think you have taken too much of this medicine contact a poison control center or emergency room at once. NOTE: This medicine is only for you. Do not share this medicine with others. What if I miss a dose? If you miss a dose, take it as soon as you can unless it is more than 4 hours late. If it is more than 4 hours late, skip the missed dose. Take the next dose at the normal time. What may interact with this medication? Do not take this medication with any of the following: Pimozide Thioridazine This medication  may also interact with the following: Beta blockers Caffeine Certain medications for mental health conditions Cimetidine Cyclosporine Medications for fungal infections like fluconazole and ketoconazole Medications for irregular heartbeat like amiodarone, flecainide and propafenone Rifampin Warfarin This list may not describe all possible interactions. Give your health care provider a list of all the medicines, herbs, non-prescription drugs, or dietary supplements you use. Also tell them if you smoke, drink alcohol, or use illegal drugs. Some items may interact with your medicine. What should I watch for while using this medication? Visit your care team for regular checks on your progress. You may need blood work while you are taking this medication. It may be some time before you see the benefit from this medication. This medication may cause serious skin reactions. They can happen weeks to months after starting the medication. Contact your care team right away if you notice fevers or flu-like symptoms with a rash. The rash may be red or purple and then turn into blisters or peeling of the skin. Or, you might notice a red rash with swelling of the face, lips or lymph nodes in your neck or under your arms. This medication can make you more sensitive to the sun. Keep out of the  sun, If you cannot avoid being in the sun, wear protective clothing and sunscreen. Do not use sun lamps or tanning beds/booths. What side effects may I notice from receiving this medication? Side effects that you should report to your care team as soon as possible: Allergic reactions--skin rash, itching, hives, swelling of the face, lips, tongue, or throat Change in sense of smell Change in taste Infection--fever, chills, cough, or sore throat Liver injury--right upper belly pain, loss of appetite, nausea, light-colored stool, dark yellow or brown urine, yellowing skin or eyes, unusual weakness or fatigue Low red blood  cell level--unusual weakness or fatigue, dizziness, headache, trouble breathing Lupus-like syndrome--joint pain, swelling, or stiffness, butterfly-shaped rash on the face, rashes that get worse in the sun, fever, unusual weakness or fatigue Rash, fever, and swollen lymph nodes Redness, blistering, peeling, or loosening of the skin, including inside the mouth Unusual bruising or bleeding Worsening mood, feelings of depression Side effects that usually do not require medical attention (report to your care team if they continue or are bothersome): Diarrhea Gas Headache Nausea Stomach pain Upset stomach This list may not describe all possible side effects. Call your doctor for medical advice about side effects. You may report side effects to FDA at 1-800-FDA-1088. Where should I keep my medication? Keep out of the reach of children and pets. Store between 20 and 25 degrees C (68 and 77 degrees F). Protect from light. Get rid of any unused medication after the expiration date. To get rid of medications that are no longer needed or have expired: Take the medication to a medication take-back program. Check with your pharmacy or law enforcement to find a location. If you cannot return the medication, check the label or package insert to see if the medication should be thrown out in the garbage or flushed down the toilet. If you are not sure, ask your care team. If it is safe to put it in the trash, take the medication out of the container. Mix the medication with cat litter, dirt, coffee grounds, or other unwanted substance. Seal the mixture in a bag or container. Put it in the trash. NOTE: This sheet is a summary. It may not cover all possible information. If you have questions about this medicine, talk to your doctor, pharmacist, or health care provider.  2022 Elsevier/Gold Standard (2021-06-27 00:00:00)

## 2022-01-07 DIAGNOSIS — B351 Tinea unguium: Secondary | ICD-10-CM | POA: Insufficient documentation

## 2022-01-07 NOTE — Progress Notes (Signed)
Subjective: 66 year old-female presents the office today for follow-up evaluation of nail fungus.  She is, been on Lamisil without any side effects.  She has seen some improvement in the nail.  Denies any drainage or pus or any signs of infection otherwise.  She has no other concerns.  Objective: AAO x3, NAD DP/PT pulses palpable bilaterally, CRT less than 3 seconds Nails continue be hypertrophic and dystrophic with the right hallux nail most affected.  The nails appear to be dystrophic with yellow-brown discoloration.  There is some clearing of the proximal nail fold.  There is no edema, erythema, drainage or pus or any signs of infection. No pain with calf compression, swelling, warmth, erythema  Assessment: Onychomycosis, currently on Lamisil without side effects  Plan: -All treatment options discussed with the patient including all alternatives, risks, complications.  -At this point discussed continuing Lamisil.  I reviewed blood work that she brought into the office today.  Creatinine 0.76, AST 23, ALT 16, white blood cell count 6.1.  We will proceed with 30 more days of Lamisil.  Continue to monitor for any side effects. -Also prescribe Jublia.  She can start this now but also when she finishes the Lamisil to continue to help the nails grow out. -Patient encouraged to call the office with any questions, concerns, change in symptoms.   Trula Slade DPM

## 2022-04-04 ENCOUNTER — Ambulatory Visit (INDEPENDENT_AMBULATORY_CARE_PROVIDER_SITE_OTHER): Payer: BC Managed Care – PPO | Admitting: Podiatry

## 2022-04-04 DIAGNOSIS — B351 Tinea unguium: Secondary | ICD-10-CM

## 2022-04-04 NOTE — Patient Instructions (Signed)
Continue Jublia  until the nails grow out  ?

## 2022-04-06 NOTE — Progress Notes (Signed)
Subjective: ?66 year old female presents the office today for follow-up evaluation of nail fungus.  She has been on the [place] but just recently ran out.  She is asking if she should continue with this.  She feels the nails are getting better but she wants to make sure that it has resolved.  No swelling or redness or drainage or any pain to the toenail sites.  She has no other concerns to her feet. ? ?Objective: ?AAO x3, NAD ?DP/PT pulses palpable bilaterally, CRT less than 3 seconds ?Overall the nails do appear to be growing out looks clean along the proximal nail fold but the nails do remain mildly hypertrophic, dystrophic with yellow, brown discoloration.  The nails affected on the right hallux, fourth, fifth toe as well as the left fifth toe. ?No pain with calf compression, swelling, warmth, erythema ? ?Assessment: ?Onychomycosis ? ?Plan: ?-All treatment options discussed with the patient including all alternatives, risks, complications.  ?-At this point recommend continue with Jublia.  She is to contact the pharmacy for refill.  I would continue this until the nails completely grow out.  Discussed external measures as well to help with recurrence of fungus. ?-Patient encouraged to call the office with any questions, concerns, change in symptoms.  ? ?Trula Slade DPM ? ?

## 2022-05-14 ENCOUNTER — Ambulatory Visit: Payer: BC Managed Care – PPO | Admitting: Dermatology

## 2022-11-26 ENCOUNTER — Ambulatory Visit: Payer: BC Managed Care – PPO | Admitting: Dermatology

## 2023-01-06 ENCOUNTER — Other Ambulatory Visit: Payer: Self-pay | Admitting: Podiatry

## 2023-01-07 ENCOUNTER — Ambulatory Visit (INDEPENDENT_AMBULATORY_CARE_PROVIDER_SITE_OTHER): Payer: Medicare Other | Admitting: Podiatry

## 2023-01-07 DIAGNOSIS — B351 Tinea unguium: Secondary | ICD-10-CM | POA: Diagnosis not present

## 2023-01-07 DIAGNOSIS — Z79899 Other long term (current) drug therapy: Secondary | ICD-10-CM

## 2023-01-07 NOTE — Patient Instructions (Signed)
Terbinafine Tablets What is this medication? TERBINAFINE (TER bin a feen) treats fungal infections of the nails. It belongs to a group of medications called antifungals. It will not treat infections caused by bacteria or viruses. This medicine may be used for other purposes; ask your health care provider or pharmacist if you have questions. COMMON BRAND NAME(S): Lamisil, Terbinex What should I tell my care team before I take this medication? They need to know if you have any of these conditions: Liver disease An unusual or allergic reaction to terbinafine, other medications, foods, dyes, or preservatives Pregnant or trying to get pregnant Breast-feeding How should I use this medication? Take this medication by mouth with water. Take it as directed on the prescription label at the same time every day. You can take it with or without food. If it upsets your stomach, take it with food. Keep taking it unless your care team tells you to stop. A special MedGuide will be given to you by the pharmacist with each prescription and refill. Be sure to read this information carefully each time. Talk to your care team regarding the use of this medication in children. Special care may be needed. Overdosage: If you think you have taken too much of this medicine contact a poison control center or emergency room at once. NOTE: This medicine is only for you. Do not share this medicine with others. What if I miss a dose? If you miss a dose, take it as soon as you can unless it is more than 4 hours late. If it is more than 4 hours late, skip the missed dose. Take the next dose at the normal time. What may interact with this medication? Do not take this medication with any of the following: Pimozide Thioridazine This medication may also interact with the following: Beta blockers Caffeine Certain medications for mental health conditions Cimetidine Cyclosporine Medications for fungal infections like fluconazole  and ketoconazole Medications for irregular heartbeat like amiodarone, flecainide and propafenone Rifampin Warfarin This list may not describe all possible interactions. Give your health care provider a list of all the medicines, herbs, non-prescription drugs, or dietary supplements you use. Also tell them if you smoke, drink alcohol, or use illegal drugs. Some items may interact with your medicine. What should I watch for while using this medication? Visit your care team for regular checks on your progress. You may need blood work while you are taking this medication. It may be some time before you see the benefit from this medication. This medication may cause serious skin reactions. They can happen weeks to months after starting the medication. Contact your care team right away if you notice fevers or flu-like symptoms with a rash. The rash may be red or purple and then turn into blisters or peeling of the skin. Or, you might notice a red rash with swelling of the face, lips or lymph nodes in your neck or under your arms. This medication can make you more sensitive to the sun. Keep out of the sun, If you cannot avoid being in the sun, wear protective clothing and sunscreen. Do not use sun lamps or tanning beds/booths. What side effects may I notice from receiving this medication? Side effects that you should report to your care team as soon as possible: Allergic reactions--skin rash, itching, hives, swelling of the face, lips, tongue, or throat Change in sense of smell Change in taste Infection--fever, chills, cough, or sore throat Liver injury--right upper belly pain, loss of appetite, nausea,  light-colored stool, dark yellow or brown urine, yellowing skin or eyes, unusual weakness or fatigue Low red blood cell level--unusual weakness or fatigue, dizziness, headache, trouble breathing Lupus-like syndrome--joint pain, swelling, or stiffness, butterfly-shaped rash on the face, rashes that get worse  in the sun, fever, unusual weakness or fatigue Rash, fever, and swollen lymph nodes Redness, blistering, peeling, or loosening of the skin, including inside the mouth Unusual bruising or bleeding Worsening mood, feelings of depression Side effects that usually do not require medical attention (report to your care team if they continue or are bothersome): Diarrhea Gas Headache Nausea Stomach pain Upset stomach This list may not describe all possible side effects. Call your doctor for medical advice about side effects. You may report side effects to FDA at 1-800-FDA-1088. Where should I keep my medication? Keep out of the reach of children and pets. Store between 20 and 25 degrees C (68 and 77 degrees F). Protect from light. Get rid of any unused medication after the expiration date. To get rid of medications that are no longer needed or have expired: Take the medication to a medication take-back program. Check with your pharmacy or law enforcement to find a location. If you cannot return the medication, check the label or package insert to see if the medication should be thrown out in the garbage or flushed down the toilet. If you are not sure, ask your care team. If it is safe to put it in the trash, take the medication out of the container. Mix the medication with cat litter, dirt, coffee grounds, or other unwanted substance. Seal the mixture in a bag or container. Put it in the trash. NOTE: This sheet is a summary. It may not cover all possible information. If you have questions about this medicine, talk to your doctor, pharmacist, or health care provider.  2023 Elsevier/Gold Standard (2021-06-05 00:00:00)

## 2023-01-07 NOTE — Progress Notes (Signed)
Subjective: Chief Complaint  Patient presents with   Tinea Pedis    Bilateral feet, prior tx was terbinafine    67 year old female presents the office today with above concerns.  She states that she was doing well on the Lamisil and it did improve however most in her big toe stiffness and discoloration.  She is interested in going back on the medication.  Objective: AAO x3, NAD DP/PT pulses palpable bilaterally, CRT less than 3 seconds Bilateral hallux nails with mild hypertrophy, dystrophy with yellow, brown discoloration.  There is no edema, erythema or signs of infection.  No open lesions.  No pain in the nails today. No pain with calf compression, swelling, warmth, erythema  Assessment: Onychomycosis  Plan: -All treatment options discussed with the patient including all alternatives, risks, complications.  -Discussed treatment options for nail fungus including oral, topical as well as alternative treatments.  At this time the patient wants to proceed with oral Lamisil.  We discussed side effects of the medication and success rates.  We will check a CBC and LFT prior to starting the medication.  Once I receive the results of this I will then call the medication in.   -Patient encouraged to call the office with any questions, concerns, change in symptoms.

## 2023-01-11 LAB — CBC WITH DIFFERENTIAL/PLATELET
Absolute Monocytes: 511 cells/uL (ref 200–950)
Basophils Absolute: 32 cells/uL (ref 0–200)
Basophils Relative: 0.7 %
Eosinophils Absolute: 152 cells/uL (ref 15–500)
Eosinophils Relative: 3.3 %
HCT: 41.7 % (ref 35.0–45.0)
Hemoglobin: 13.8 g/dL (ref 11.7–15.5)
Lymphs Abs: 1026 cells/uL (ref 850–3900)
MCH: 28 pg (ref 27.0–33.0)
MCHC: 33.1 g/dL (ref 32.0–36.0)
MCV: 84.8 fL (ref 80.0–100.0)
MPV: 9.7 fL (ref 7.5–12.5)
Monocytes Relative: 11.1 %
Neutro Abs: 2880 cells/uL (ref 1500–7800)
Neutrophils Relative %: 62.6 %
Platelets: 218 10*3/uL (ref 140–400)
RBC: 4.92 10*6/uL (ref 3.80–5.10)
RDW: 12.4 % (ref 11.0–15.0)
Total Lymphocyte: 22.3 %
WBC: 4.6 10*3/uL (ref 3.8–10.8)

## 2023-01-11 LAB — HEPATIC FUNCTION PANEL
AG Ratio: 1.9 (calc) (ref 1.0–2.5)
ALT: 17 U/L (ref 6–29)
AST: 21 U/L (ref 10–35)
Albumin: 4 g/dL (ref 3.6–5.1)
Alkaline phosphatase (APISO): 91 U/L (ref 37–153)
Bilirubin, Direct: 0.1 mg/dL (ref 0.0–0.2)
Globulin: 2.1 g/dL (calc) (ref 1.9–3.7)
Indirect Bilirubin: 0.2 mg/dL (calc) (ref 0.2–1.2)
Total Bilirubin: 0.3 mg/dL (ref 0.2–1.2)
Total Protein: 6.1 g/dL (ref 6.1–8.1)

## 2023-01-13 ENCOUNTER — Other Ambulatory Visit: Payer: Self-pay | Admitting: Podiatry

## 2023-01-13 DIAGNOSIS — Z79899 Other long term (current) drug therapy: Secondary | ICD-10-CM

## 2023-01-13 MED ORDER — TERBINAFINE HCL 250 MG PO TABS: 250.0000 mg | ORAL_TABLET | Freq: Every day | ORAL | 0 refills | Status: DC

## 2023-03-31 ENCOUNTER — Telehealth: Payer: Self-pay | Admitting: Podiatry

## 2023-03-31 NOTE — Telephone Encounter (Signed)
Pt left message stating she is being treated with lamisil and she believes she needs new labs prior to her upcoming appt. Could we send lab orders or pt can pick up lab orders to go in before her appt please let pt know either way.

## 2023-04-01 NOTE — Telephone Encounter (Signed)
Left message for pt that orders are in chart for [facility] and I can fax to the location if she would let us know which one or she can stop by the office and pick up the orders.

## 2023-04-03 LAB — CBC WITH DIFFERENTIAL/PLATELET
Absolute Monocytes: 489 cells/uL (ref 200–950)
Basophils Absolute: 67 cells/uL (ref 0–200)
Basophils Relative: 1 %
Eosinophils Absolute: 147 cells/uL (ref 15–500)
Eosinophils Relative: 2.2 %
HCT: 43.4 % (ref 35.0–45.0)
Hemoglobin: 14.4 g/dL (ref 11.7–15.5)
Lymphs Abs: 1126 cells/uL (ref 850–3900)
MCH: 27.1 pg (ref 27.0–33.0)
MCHC: 33.2 g/dL (ref 32.0–36.0)
MCV: 81.7 fL (ref 80.0–100.0)
MPV: 9.7 fL (ref 7.5–12.5)
Monocytes Relative: 7.3 %
Neutro Abs: 4871 cells/uL (ref 1500–7800)
Neutrophils Relative %: 72.7 %
Platelets: 239 10*3/uL (ref 140–400)
RBC: 5.31 10*6/uL — ABNORMAL HIGH (ref 3.80–5.10)
RDW: 13.2 % (ref 11.0–15.0)
Total Lymphocyte: 16.8 %
WBC: 6.7 10*3/uL (ref 3.8–10.8)

## 2023-04-03 LAB — HEPATIC FUNCTION PANEL
AG Ratio: 2 (calc) (ref 1.0–2.5)
ALT: 19 U/L (ref 6–29)
AST: 27 U/L (ref 10–35)
Albumin: 4.3 g/dL (ref 3.6–5.1)
Alkaline phosphatase (APISO): 94 U/L (ref 37–153)
Bilirubin, Direct: 0.1 mg/dL (ref 0.0–0.2)
Globulin: 2.2 g/dL (calc) (ref 1.9–3.7)
Indirect Bilirubin: 0.2 mg/dL (calc) (ref 0.2–1.2)
Total Bilirubin: 0.3 mg/dL (ref 0.2–1.2)
Total Protein: 6.5 g/dL (ref 6.1–8.1)

## 2023-04-07 ENCOUNTER — Encounter: Payer: Self-pay | Admitting: Podiatry

## 2023-04-07 ENCOUNTER — Ambulatory Visit (INDEPENDENT_AMBULATORY_CARE_PROVIDER_SITE_OTHER): Payer: Medicare Other | Admitting: Podiatry

## 2023-04-07 DIAGNOSIS — B351 Tinea unguium: Secondary | ICD-10-CM | POA: Diagnosis not present

## 2023-04-07 MED ORDER — TERBINAFINE HCL 250 MG PO TABS: 250.0000 mg | ORAL_TABLET | Freq: Every day | ORAL | 0 refills | Status: AC

## 2023-04-07 NOTE — Progress Notes (Signed)
Subjective: Chief Complaint  Patient presents with   Nail Problem    Follow up nail fungus - completed Rx'd lamisil   "I think I need a little bit more medication to get this big toenail on the right foot completely treated"   67 year old female presents with above concerns.  She states that she can tell quite a bit of improvement however the nail has not grown out and she wants to consider extending the Lamisil.  She has not had any side effects.  No pain to the nail and no swelling redness or drainage.  No open lesions.   Objective: AAO x3, NAD DP/PT pulses palpable bilaterally, CRT less than 3 seconds Overall the nail is doing much better.  Scarring about halfway there is clearing on the proximal nail fold.  There is no edema, erythema or signs of infection.  There are no open lesions today. No pain with calf compression, swelling, warmth, erythema  Assessment: Onychomycosis  Plan: -All treatment options discussed with the patient including all alternatives, risks, complications.  -Overall she is doing much better but the nail is still growing out and she wants to continue Lamisil which I agree with.  She does not experience any side effects we will continue to monitor.  Recent blood work was within normal limits.  Will extend Lamisil for 30 more days.  This was sent today. -Patient encouraged to call the office with any questions, concerns, change in symptoms.   Vivi Barrack DPM

## 2023-04-07 NOTE — Patient Instructions (Signed)
Terbinafine Tablets What is this medication? TERBINAFINE (TER bin a feen) treats fungal infections of the nails. It belongs to a group of medications called antifungals. It will not treat infections caused by bacteria or viruses. This medicine may be used for other purposes; ask your health care provider or pharmacist if you have questions. COMMON BRAND NAME(S): Lamisil, Terbinex What should I tell my care team before I take this medication? They need to know if you have any of these conditions: Liver disease An unusual or allergic reaction to terbinafine, other medications, foods, dyes, or preservatives Pregnant or trying to get pregnant Breast-feeding How should I use this medication? Take this medication by mouth with water. Take it as directed on the prescription label at the same time every day. You can take it with or without food. If it upsets your stomach, take it with food. Keep taking it unless your care team tells you to stop. A special MedGuide will be given to you by the pharmacist with each prescription and refill. Be sure to read this information carefully each time. Talk to your care team regarding the use of this medication in children. Special care may be needed. Overdosage: If you think you have taken too much of this medicine contact a poison control center or emergency room at once. NOTE: This medicine is only for you. Do not share this medicine with others. What if I miss a dose? If you miss a dose, take it as soon as you can unless it is more than 4 hours late. If it is more than 4 hours late, skip the missed dose. Take the next dose at the normal time. What may interact with this medication? Do not take this medication with any of the following: Pimozide Thioridazine This medication may also interact with the following: Beta blockers Caffeine Certain medications for mental health conditions Cimetidine Cyclosporine Medications for fungal infections like fluconazole  and ketoconazole Medications for irregular heartbeat like amiodarone, flecainide and propafenone Rifampin Warfarin This list may not describe all possible interactions. Give your health care provider a list of all the medicines, herbs, non-prescription drugs, or dietary supplements you use. Also tell them if you smoke, drink alcohol, or use illegal drugs. Some items may interact with your medicine. What should I watch for while using this medication? Visit your care team for regular checks on your progress. You may need blood work while you are taking this medication. It may be some time before you see the benefit from this medication. This medication may cause serious skin reactions. They can happen weeks to months after starting the medication. Contact your care team right away if you notice fevers or flu-like symptoms with a rash. The rash may be red or purple and then turn into blisters or peeling of the skin. Or, you might notice a red rash with swelling of the face, lips or lymph nodes in your neck or under your arms. This medication can make you more sensitive to the sun. Keep out of the sun, If you cannot avoid being in the sun, wear protective clothing and sunscreen. Do not use sun lamps or tanning beds/booths. What side effects may I notice from receiving this medication? Side effects that you should report to your care team as soon as possible: Allergic reactions--skin rash, itching, hives, swelling of the face, lips, tongue, or throat Change in sense of smell Change in taste Infection--fever, chills, cough, or sore throat Liver injury--right upper belly pain, loss of appetite, nausea,   light-colored stool, dark yellow or brown urine, yellowing skin or eyes, unusual weakness or fatigue Low red blood cell level--unusual weakness or fatigue, dizziness, headache, trouble breathing Lupus-like syndrome--joint pain, swelling, or stiffness, butterfly-shaped rash on the face, rashes that get worse  in the sun, fever, unusual weakness or fatigue Rash, fever, and swollen lymph nodes Redness, blistering, peeling, or loosening of the skin, including inside the mouth Unusual bruising or bleeding Worsening mood, feelings of depression Side effects that usually do not require medical attention (report to your care team if they continue or are bothersome): Diarrhea Gas Headache Nausea Stomach pain Upset stomach This list may not describe all possible side effects. Call your doctor for medical advice about side effects. You may report side effects to FDA at 1-800-FDA-1088. Where should I keep my medication? Keep out of the reach of children and pets. Store between 20 and 25 degrees C (68 and 77 degrees F). Protect from light. Get rid of any unused medication after the expiration date. To get rid of medications that are no longer needed or have expired: Take the medication to a medication take-back program. Check with your pharmacy or law enforcement to find a location. If you cannot return the medication, check the label or package insert to see if the medication should be thrown out in the garbage or flushed down the toilet. If you are not sure, ask your care team. If it is safe to put it in the trash, take the medication out of the container. Mix the medication with cat litter, dirt, coffee grounds, or other unwanted substance. Seal the mixture in a bag or container. Put it in the trash. NOTE: This sheet is a summary. It may not cover all possible information. If you have questions about this medicine, talk to your doctor, pharmacist, or health care provider.  2023 [employer]vier/Gold Standard (2021-06-05 00:00:00)  

## 2023-05-07 ENCOUNTER — Encounter: Payer: Self-pay | Admitting: Podiatry

## 2023-05-12 ENCOUNTER — Other Ambulatory Visit: Payer: Self-pay | Admitting: Podiatry

## 2023-05-12 MED ORDER — JUBLIA 10 % EX SOLN: CUTANEOUS | 10 refills | Status: AC
